# Patient Record
Sex: Male | Born: 1965 | Race: Black or African American | Hispanic: No | Marital: Married | Smoking: Never smoker
Health system: Southern US, Community
[De-identification: ages and names within clinical notes are randomized; demographics above are authoritative.]

## PROBLEM LIST (undated history)

## (undated) DIAGNOSIS — E785 Hyperlipidemia, unspecified: Secondary | ICD-10-CM

## (undated) DIAGNOSIS — M549 Dorsalgia, unspecified: Secondary | ICD-10-CM

## (undated) DIAGNOSIS — I8390 Asymptomatic varicose veins of unspecified lower extremity: Secondary | ICD-10-CM

## (undated) DIAGNOSIS — G8929 Other chronic pain: Secondary | ICD-10-CM

## (undated) DIAGNOSIS — K219 Gastro-esophageal reflux disease without esophagitis: Secondary | ICD-10-CM

## (undated) DIAGNOSIS — Z21 Asymptomatic human immunodeficiency virus [HIV] infection status: Secondary | ICD-10-CM

## (undated) DIAGNOSIS — N200 Calculus of kidney: Secondary | ICD-10-CM

## (undated) HISTORY — DX: Asymptomatic varicose veins of unspecified lower extremity: I83.90

## (undated) HISTORY — PX: KNEE SURGERY: SHX244

---

## 2001-11-14 ENCOUNTER — Emergency Department (HOSPITAL_COMMUNITY): Admission: EM | Admit: 2001-11-14 | Discharge: 2001-11-14 | Payer: Self-pay | Admitting: Emergency Medicine

## 2004-01-12 ENCOUNTER — Encounter: Admission: RE | Admit: 2004-01-12 | Discharge: 2004-01-12 | Payer: Self-pay | Admitting: *Deleted

## 2004-01-15 ENCOUNTER — Encounter: Admission: RE | Admit: 2004-01-15 | Discharge: 2004-01-15 | Payer: Self-pay | Admitting: Family Medicine

## 2004-10-22 ENCOUNTER — Encounter: Admission: RE | Admit: 2004-10-22 | Discharge: 2004-10-22 | Payer: Self-pay | Admitting: Family Medicine

## 2005-01-06 ENCOUNTER — Ambulatory Visit: Payer: Self-pay | Admitting: Internal Medicine

## 2005-01-16 ENCOUNTER — Ambulatory Visit: Payer: Self-pay | Admitting: Internal Medicine

## 2005-01-17 ENCOUNTER — Ambulatory Visit: Payer: Self-pay | Admitting: Infectious Diseases

## 2005-01-17 ENCOUNTER — Inpatient Hospital Stay (HOSPITAL_COMMUNITY): Admission: EM | Admit: 2005-01-17 | Discharge: 2005-01-20 | Payer: Self-pay | Admitting: Emergency Medicine

## 2005-01-18 ENCOUNTER — Ambulatory Visit: Payer: Self-pay | Admitting: Internal Medicine

## 2005-02-13 ENCOUNTER — Ambulatory Visit: Payer: Self-pay | Admitting: Infectious Diseases

## 2005-02-28 ENCOUNTER — Ambulatory Visit: Payer: Self-pay | Admitting: Infectious Diseases

## 2010-10-23 ENCOUNTER — Encounter: Payer: Self-pay | Admitting: Family Medicine

## 2014-05-04 ENCOUNTER — Encounter: Payer: Self-pay | Admitting: Internal Medicine

## 2015-06-29 ENCOUNTER — Emergency Department (HOSPITAL_BASED_OUTPATIENT_CLINIC_OR_DEPARTMENT_OTHER)
Admission: EM | Admit: 2015-06-29 | Discharge: 2015-06-29 | Disposition: A | Discharge status: Home / Self Care | Payer: Managed Care, Other (non HMO) | Attending: Emergency Medicine | Admitting: Emergency Medicine

## 2015-06-29 ENCOUNTER — Emergency Department (HOSPITAL_BASED_OUTPATIENT_CLINIC_OR_DEPARTMENT_OTHER): Payer: Managed Care, Other (non HMO)

## 2015-06-29 ENCOUNTER — Encounter (HOSPITAL_BASED_OUTPATIENT_CLINIC_OR_DEPARTMENT_OTHER): Payer: Self-pay | Admitting: *Deleted

## 2015-06-29 DIAGNOSIS — Y9389 Activity, other specified: Secondary | ICD-10-CM | POA: Diagnosis not present

## 2015-06-29 DIAGNOSIS — Z79899 Other long term (current) drug therapy: Secondary | ICD-10-CM | POA: Diagnosis not present

## 2015-06-29 DIAGNOSIS — Y998 Other external cause status: Secondary | ICD-10-CM | POA: Diagnosis not present

## 2015-06-29 DIAGNOSIS — X58XXXA Exposure to other specified factors, initial encounter: Secondary | ICD-10-CM | POA: Insufficient documentation

## 2015-06-29 DIAGNOSIS — Z8639 Personal history of other endocrine, nutritional and metabolic disease: Secondary | ICD-10-CM | POA: Insufficient documentation

## 2015-06-29 DIAGNOSIS — Z87442 Personal history of urinary calculi: Secondary | ICD-10-CM | POA: Insufficient documentation

## 2015-06-29 DIAGNOSIS — Y9289 Other specified places as the place of occurrence of the external cause: Secondary | ICD-10-CM | POA: Diagnosis not present

## 2015-06-29 DIAGNOSIS — K219 Gastro-esophageal reflux disease without esophagitis: Secondary | ICD-10-CM | POA: Diagnosis not present

## 2015-06-29 DIAGNOSIS — S39012A Strain of muscle, fascia and tendon of lower back, initial encounter: Secondary | ICD-10-CM

## 2015-06-29 DIAGNOSIS — R1031 Right lower quadrant pain: Secondary | ICD-10-CM | POA: Diagnosis present

## 2015-06-29 DIAGNOSIS — G8929 Other chronic pain: Secondary | ICD-10-CM | POA: Diagnosis not present

## 2015-06-29 DIAGNOSIS — Z21 Asymptomatic human immunodeficiency virus [HIV] infection status: Secondary | ICD-10-CM | POA: Diagnosis not present

## 2015-06-29 DIAGNOSIS — R109 Unspecified abdominal pain: Secondary | ICD-10-CM | POA: Diagnosis not present

## 2015-06-29 HISTORY — DX: Other chronic pain: G89.29

## 2015-06-29 HISTORY — DX: Gastro-esophageal reflux disease without esophagitis: K21.9

## 2015-06-29 HISTORY — DX: Asymptomatic human immunodeficiency virus (hiv) infection status: Z21

## 2015-06-29 HISTORY — DX: Calculus of kidney: N20.0

## 2015-06-29 HISTORY — DX: Hyperlipidemia, unspecified: E78.5

## 2015-06-29 HISTORY — DX: Dorsalgia, unspecified: M54.9

## 2015-06-29 LAB — BASIC METABOLIC PANEL
Anion gap: 6 (ref 5–15)
BUN: 12 mg/dL (ref 6–20)
CALCIUM: 8.9 mg/dL (ref 8.9–10.3)
CHLORIDE: 102 mmol/L (ref 101–111)
CO2: 29 mmol/L (ref 22–32)
CREATININE: 0.91 mg/dL (ref 0.61–1.24)
Glucose, Bld: 135 mg/dL — ABNORMAL HIGH (ref 65–99)
Potassium: 4.1 mmol/L (ref 3.5–5.1)
SODIUM: 137 mmol/L (ref 135–145)

## 2015-06-29 LAB — CBC WITH DIFFERENTIAL/PLATELET
BASOS PCT: 0 %
Basophils Absolute: 0 10*3/uL (ref 0.0–0.1)
EOS ABS: 0 10*3/uL (ref 0.0–0.7)
EOS PCT: 1 %
HCT: 43.3 % (ref 39.0–52.0)
HEMOGLOBIN: 15.3 g/dL (ref 13.0–17.0)
Lymphocytes Relative: 18 %
Lymphs Abs: 0.6 10*3/uL — ABNORMAL LOW (ref 0.7–4.0)
MCH: 32.1 pg (ref 26.0–34.0)
MCHC: 35.3 g/dL (ref 30.0–36.0)
MCV: 91 fL (ref 78.0–100.0)
MONOS PCT: 13 %
Monocytes Absolute: 0.4 10*3/uL (ref 0.1–1.0)
NEUTROS PCT: 68 %
Neutro Abs: 2.2 10*3/uL (ref 1.7–7.7)
PLATELETS: 149 10*3/uL — AB (ref 150–400)
RBC: 4.76 MIL/uL (ref 4.22–5.81)
RDW: 12 % (ref 11.5–15.5)
WBC: 3.2 10*3/uL — AB (ref 4.0–10.5)

## 2015-06-29 LAB — URINALYSIS, ROUTINE W REFLEX MICROSCOPIC
BILIRUBIN URINE: NEGATIVE
Glucose, UA: NEGATIVE mg/dL
HGB URINE DIPSTICK: NEGATIVE
KETONES UR: NEGATIVE mg/dL
Leukocytes, UA: NEGATIVE
NITRITE: NEGATIVE
PH: 6 (ref 5.0–8.0)
Protein, ur: NEGATIVE mg/dL
Specific Gravity, Urine: 1.018 (ref 1.005–1.030)
UROBILINOGEN UA: 0.2 mg/dL (ref 0.0–1.0)

## 2015-06-29 MED ORDER — MORPHINE SULFATE (PF) 4 MG/ML IV SOLN: 4.0000 mg | Freq: Once | INTRAVENOUS | Status: DC

## 2015-06-29 MED ORDER — KETOROLAC TROMETHAMINE 30 MG/ML IJ SOLN: 30.0000 mg | Freq: Once | INTRAMUSCULAR | Status: DC

## 2015-06-29 MED ORDER — SODIUM CHLORIDE 0.9 % IV BOLUS (SEPSIS)
1000.0000 mL | Freq: Once | INTRAVENOUS | Status: AC
  Administered 2015-06-29: 1000 mL via INTRAVENOUS

## 2015-06-29 MED ORDER — HYDROMORPHONE HCL 1 MG/ML IJ SOLN
1.0000 mg | Freq: Once | INTRAMUSCULAR | Status: AC
  Administered 2015-06-29: 1 mg via INTRAVENOUS
  Filled 2015-06-29: qty 1

## 2015-06-29 MED ORDER — IBUPROFEN 800 MG PO TABS: 800.0000 mg | ORAL_TABLET | Freq: Three times a day (TID) | ORAL | Status: DC | PRN

## 2015-06-29 MED ORDER — DIAZEPAM 5 MG PO TABS
10.0000 mg | ORAL_TABLET | Freq: Once | ORAL | Status: AC
  Administered 2015-06-29: 10 mg via ORAL
  Filled 2015-06-29: qty 2

## 2015-06-29 MED ORDER — ONDANSETRON HCL 4 MG/2ML IJ SOLN
4.0000 mg | Freq: Once | INTRAMUSCULAR | Status: AC
  Administered 2015-06-29: 4 mg via INTRAVENOUS
  Filled 2015-06-29: qty 2

## 2015-06-29 MED ORDER — DIAZEPAM 5 MG PO TABS: 5.0000 mg | ORAL_TABLET | Freq: Three times a day (TID) | ORAL | Status: DC | PRN

## 2015-06-29 NOTE — ED Provider Notes (Signed)
CSN: [id number]     Arrival date & time 06/29/15  1511 History   First MD Initiated Contact with Patient 06/29/15 1516     Chief Complaint  Patient presents with  . Flank Pain     (Consider location/radiation/quality/duration/timing/severity/associated sxs/prior Treatment) HPI 49 year old male who presents with right flank pain. History of nephrolithiasis and HIV with reported normal CD4 count. Had sudden onset of right flank pain radiating to the right lower quadrant this morning at 10 AM that woke him up from sleep. Associated with nausea, I reports this is the same pain that he had felt with his prior kidney stone. Denies fever, chills, vomiting, diarrhea, testicular pain or swelling, dysuria, or hematuria. I was initially seen by his primary care physician at [facility] physicians, and sent to the ED for further management. Did receive 60 mg of IM Toradol prior to arrival.   Past Medical History  Diagnosis Date  . Kidney stone   . HIV positive   . GERD (gastroesophageal reflux disease)   . Chronic back pain   . Dyslipidemia    Past Surgical History  Procedure Laterality Date  . Knee surgery     History reviewed. No pertinent family history. Social History  Substance Use Topics  . Smoking status: Never Smoker   . Smokeless tobacco: None  . Alcohol Use: Yes     Comment: 10 beers /week    Review of Systems 10/14 systems reviewed and are negative other than those stated in the HPI  Allergies  Review of patient's allergies indicates no known allergies.  Home Medications   Prior to Admission medications   Medication Sig Start Date End Date Taking? Authorizing Fleeta Kunde  efavirenz-emtricitabine-tenofovir (ATRIPLA) 600-200-300 MG tablet Take 1 tablet by mouth at bedtime.   Yes Historical Khori Rosevear, MD  OMEPRAZOLE PO Take by mouth.   Yes Historical Michelene Keniston, MD  sildenafil (REVATIO) 20 MG tablet Take 20 mg by mouth 3 (three) times daily.   Yes Historical Daxson Reffett, MD   BP 139/87  mmHg  Pulse 53  Temp(Src) 97.5 F (36.4 C) (Oral)  Resp 18  Ht  (1.778 m)  Wt 188 lb (85.276 kg)  BMI 26.98 kg/m2  SpO2 100% Physical Exam Physical Exam  Nursing note and vitals reviewed. Constitutional: Well developed, well nourished, non-toxic, and in no acute distress Head: Normocephalic and atraumatic.  Mouth/Throat: Oropharynx is clear and moist.  Neck: Normal range of motion. Neck supple.  Cardiovascular: Normal rate and regular rhythm.   Pulmonary/Chest: Effort normal and breath sounds normal.  Abdominal: Soft. Non-distended. Right CVA tenderness. Tenderness over right hemi-abdomen. There is no rebound and no guarding.  Musculoskeletal: Normal range of motion.  Neurological: Alert, no facial droop, fluent speech, moves all extremities symmetrically Skin: Skin is warm and dry.  Psychiatric: Cooperative  ED Course  Procedures (including critical care time) Labs Review Labs Reviewed  CBC WITH DIFFERENTIAL/PLATELET - Abnormal; Notable for the following:    WBC 3.2 (*)    Platelets 149 (*)    Lymphs Abs 0.6 (*)    All other components within normal limits  BASIC METABOLIC PANEL - Abnormal; Notable for the following:    Glucose, Bld 135 (*)    All other components within normal limits  URINALYSIS, ROUTINE W REFLEX MICROSCOPIC (NOT AT [facility])    Imaging Review Ct Renal Stone Study  06/29/2015   CLINICAL DATA:  Right flank pain beginning this morning.  EXAM: CT ABDOMEN AND PELVIS WITHOUT CONTRAST  TECHNIQUE: Multidetector CT  imaging of the abdomen and pelvis was performed following the standard protocol without IV contrast.  COMPARISON:  CT of the abdomen and pelvis with contrast 01/15/2004. CT of the abdomen and pelvis without contrast 01/12/2004.  FINDINGS: Minimal atelectasis is present at the lung bases. No focal nodule, mass, or other significant airspace disease is present. The heart size is normal. No significant pleural or pericardial effusion is present.  The  liver and spleen are within normal limits. The stomach, duodenum, and pancreas are within normal limits. The common bile duct and gallbladder are within normal limits.  The adrenal glands are normal bilaterally. Hyperdense lesions in the right kidney correspond to previously-seen cysts with some interval growth. No other focal lesions are evident. There are no stones. The ureters are of normal size. There is no evidence for obstruction. Urinary bladder is within normal limits.  The rectosigmoid colon is within normal limits. The more proximal colon is within normal limits. The appendix is visualized and normal. No significant adenopathy or free fluid is present. Previously-seen sub cm lymph nodes have decreased in size and number. Calcifications are present within the prostate gland without enlargement of the prostate. There is minimal fat herniated into the inguinal canals bilaterally without significant bowel.  Mild endplate degenerative changes are present at L5-S1. Bone windows are otherwise unremarkable.  IMPRESSION: 1. No significant nephrolithiasis or urinary tract obstruction. 2. Benign appearing hypodense lesions in the inner right kidney compatible with previously-seen cysts. 3. Previously noted lymph nodes have decreased in size and number. These are within normal limits on today's exam. 4. Mild degenerative changes at L5-S1.   Electronically Signed   By: Marin Roberts M.D.   On: 06/29/2015 16:36   I have personally reviewed and evaluated these images and lab results as part of my medical decision-making.    MDM   Final diagnoses:  Right flank pain  Back strain, initial encounter    49 year old male with no prior abdominal surgeries who presents with right flank pain radiating into the right abdomen, similar in presentation to prior history of kidney stones. I he is nontoxic, but appears very uncomfortable secondary to pain. He is writhing in bed and unable to lay still. Vital signs  overall non-concerning. Significant CVA tenderness noted as well as right sided abdominal tenderness. Pain also reproduced with palpation of the right paraspinal muscles. Abdomen overall soft and non-peritoneal. Given that he states that this is similar to prior kidney stones initial supportive care with IV fluids, antiemetics, analgesics were provided. Patient was still having significant pain afterwards, and there is concern for larger kidney stone that would require urological intervention. A CT abdomen pelvis without contrast was performed. This did not show any acute intra-abdominal processes that could explain his symptoms. No evidence of kidney stone, hepatobiliary pathology, appendicitis, or other processes. Likely due to backs strain, and we discussed supportive care for home. No neuro complaints and only paraspinal pain; thus no suspicion at this time for spine cord issues or other serious etiologies of back pain.  Strict return and follow-up instructions reviewed. He expressed understanding of all discharge instructions for comfortable plan of care.    Lavera Guise, MD 06/29/15 [phone]

## 2015-06-29 NOTE — ED Notes (Signed)
Right flank pain since 10am. Sent from his doctors office to be treated for a kidney stones. Pt is pacing and restless.

## 2015-06-29 NOTE — Discharge Instructions (Signed)
Ice your back at rest and avoid prolonged bedrest. Return for worsening symptoms, including fever, escalating pain, or any other symptoms concerning to you.  Back Pain, Adult Back pain is very common. The pain often gets better over time. The cause of back pain is usually not dangerous. Most people can learn to manage their back pain on their own.  HOME CARE   Stay active. Start with short walks on flat ground if you can. Try to walk farther each day.  Do not sit, drive, or stand in one place for more than 30 minutes. Do not stay in bed.  Do not avoid exercise or work. Activity can help your back heal faster.  Be careful when you bend or lift an object. Bend at your knees, keep the object close to you, and do not twist.  Sleep on a firm mattress. Lie on your side, and bend your knees. If you lie on your back, put a pillow under your knees.  Only take medicines as told by your doctor.  Put ice on the injured area.  Put ice in a plastic bag.  Place a towel between your skin and the bag.  Leave the ice on for 15-20 minutes, 03-04 times a day for the first 2 to 3 days. After that, you can switch between ice and heat packs.  Ask your doctor about back exercises or massage.  Avoid feeling anxious or stressed. Find good ways to deal with stress, such as exercise. GET HELP RIGHT AWAY IF:   Your pain does not go away with rest or medicine.  Your pain does not go away in 1 week.  You have new problems.  You do not feel well.  The pain spreads into your legs.  You cannot control when you poop (bowel movement) or pee (urinate).  Your arms or legs feel weak or lose feeling (numbness).  You feel sick to your stomach (nauseous) or throw up (vomit).  You have belly (abdominal) pain.  You feel like you may pass out (faint). MAKE SURE YOU:   Understand these instructions.  Will watch your condition.  Will get help right away if you are not doing well or get worse. Document  Released: 03/06/2008 Document Revised: 12/11/2011 Document Reviewed: 01/20/2014 [facility] Patient Information 2015 [place], [place]. This information is not intended to replace advice given to you by your health care provider. Make sure you discuss any questions you have with your health care provider.

## 2015-12-10 ENCOUNTER — Other Ambulatory Visit: Payer: Self-pay | Admitting: *Deleted

## 2015-12-10 ENCOUNTER — Encounter: Payer: Self-pay | Admitting: Vascular Surgery

## 2015-12-10 DIAGNOSIS — I83891 Varicose veins of right lower extremities with other complications: Secondary | ICD-10-CM

## 2016-01-07 ENCOUNTER — Encounter: Payer: Managed Care, Other (non HMO) | Admitting: Vascular Surgery

## 2016-01-07 ENCOUNTER — Encounter (HOSPITAL_COMMUNITY): Payer: Managed Care, Other (non HMO)

## 2016-01-12 ENCOUNTER — Encounter: Payer: Self-pay | Admitting: Vascular Surgery

## 2016-01-19 ENCOUNTER — Encounter: Payer: Managed Care, Other (non HMO) | Admitting: Vascular Surgery

## 2016-01-19 ENCOUNTER — Ambulatory Visit (HOSPITAL_COMMUNITY): Payer: Managed Care, Other (non HMO)

## 2016-04-07 ENCOUNTER — Encounter: Payer: Self-pay | Admitting: Vascular Surgery

## 2016-04-10 ENCOUNTER — Ambulatory Visit (INDEPENDENT_AMBULATORY_CARE_PROVIDER_SITE_OTHER): Payer: Managed Care, Other (non HMO) | Admitting: Vascular Surgery

## 2016-04-10 ENCOUNTER — Encounter: Payer: Self-pay | Admitting: Vascular Surgery

## 2016-04-10 VITALS — BP 126/82 | HR 96 | Temp 98.0°F | Resp 16 | Ht 70.0 in | Wt 196.0 lb

## 2016-04-10 DIAGNOSIS — I83891 Varicose veins of right lower extremities with other complications: Secondary | ICD-10-CM | POA: Diagnosis not present

## 2016-04-10 DIAGNOSIS — I868 Varicose veins of other specified sites: Secondary | ICD-10-CM | POA: Diagnosis not present

## 2016-04-10 NOTE — Progress Notes (Signed)
Subjective:     Patient ID: Devin Hines, male   DOB: 03/11/1966, 50 y.o.   MRN: [id number]  HPI this 50 year old male was referred by Waynetta Pean for evaluation of varicose veins in the right leg with recent episode of rupture of right ankle varicosity. This has happened on 2 occasions in the past 6 weeks. On 1 occasion it required 30 minutes of pressure to stop the bleeding. He has no history of DVT thrombophlebitis or stasis ulcers. He does not elastic compression stockings. He has had no symptoms in the contralateral left leg. His right leg does develop aching and throbbing discomfort in the calf and ankle area is a day progresses. He has also noticed that the skin in the right ankle area has become somewhat darkened.  Past Medical History  Diagnosis Date  . Kidney stone   . HIV positive (HCC)   . GERD (gastroesophageal reflux disease)   . Chronic back pain   . Dyslipidemia   . Varicose veins     Social History  Substance Use Topics  . Smoking status: Never Smoker   . Smokeless tobacco: Never Used  . Alcohol Use: 0.0 oz/week    0 Standard drinks or equivalent per week     Comment: 10 beers /week    Family History  Problem Relation Age of Onset  . Heart disease Mother   . Hypertension Mother     No Known Allergies   Current outpatient prescriptions:  .  efavirenz-emtricitabine-tenofovir (ATRIPLA) 600-200-300 MG tablet, Take 1 tablet by mouth at bedtime., Disp: , Rfl:  .  ibuprofen (ADVIL,MOTRIN) 800 MG tablet, Take 1 tablet (800 mg total) by mouth every 8 (eight) hours as needed for moderate pain or cramping., Disp: 21 tablet, Rfl: 0 .  OMEPRAZOLE PO, Take by mouth., Disp: , Rfl:  .  sildenafil (REVATIO) 20 MG tablet, Take 20 mg by mouth 3 (three) times daily., Disp: , Rfl:  .  diazepam (VALIUM) 5 MG tablet, Take 1 tablet (5 mg total) by mouth every 8 (eight) hours as needed for muscle spasms. (Patient not taking: Reported on 04/10/2016), Disp: 10 tablet, Rfl: 0  Filed  Vitals:   04/10/16 1134  BP: 126/82  Pulse: 96  Temp: 98 F (36.7 C)  Resp: 16  Height:  (1.778 m)  Weight: 196 lb (88.905 kg)  SpO2: 98%    Body mass index is 28.12 kg/(m^2).          Review of Systems denies chest pain, dyspnea on exertion, PND, orthopnea, hemoptysis, claudication. Patient is HIV positive. Has history of hyperlipidemia, GERD. All other systems negative and complete review of systems    Objective:   Physical Exam BP 126/82 mmHg  Pulse 96  Temp(Src) 98 F (36.7 C)  Resp 16  Ht  (1.778 m)  Wt 196 lb (88.905 kg)  BMI 28.12 kg/m2  SpO2 98%    Gen.-alert and oriented x3 in no apparent distress HEENT normal for age Lungs no rhonchi or wheezing Cardiovascular regular rhythm no murmurs carotid pulses 3+ palpable no bruits audible Abdomen soft nontender no palpable masses Musculoskeletal free of  major deformities Skin clear -no rashes Neurologic normal Lower extremities 3+ femoral and dorsalis pedis pulses palpable bilaterally with no edema on the left 1+ edema on right Right leg with large bulging varicosities and distal thigh and medial calf with network of reticular veins around right medial malleolus where bleeding has occurred on 2 occasions. Early hyperpigmentation present. No large  bulging varicosities in the left leg. One prominent vein in the left medial thigh.  Today I performed a bedside SonoSite-ultrasound exam. The right great saphenous vein is large in caliber and has continuous gross reflux and supplies these painful varicosities.       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 Because of the 2 recent episodes of hemorrhage

## 2016-04-13 ENCOUNTER — Encounter: Payer: Self-pay | Admitting: Vascular Surgery

## 2016-04-18 ENCOUNTER — Ambulatory Visit (INDEPENDENT_AMBULATORY_CARE_PROVIDER_SITE_OTHER): Payer: Managed Care, Other (non HMO) | Admitting: Vascular Surgery

## 2016-04-18 ENCOUNTER — Ambulatory Visit (HOSPITAL_COMMUNITY)
Admission: RE | Admit: 2016-04-18 | Discharge: 2016-04-18 | Disposition: A | Discharge status: Home / Self Care | Payer: Managed Care, Other (non HMO) | Source: Ambulatory Visit | Attending: Vascular Surgery | Admitting: Vascular Surgery

## 2016-04-18 ENCOUNTER — Encounter: Payer: Self-pay | Admitting: Vascular Surgery

## 2016-04-18 VITALS — BP 119/79 | HR 74 | Temp 97.3°F | Resp 18 | Ht 70.0 in | Wt 195.0 lb

## 2016-04-18 DIAGNOSIS — B20 Human immunodeficiency virus [HIV] disease: Secondary | ICD-10-CM | POA: Diagnosis not present

## 2016-04-18 DIAGNOSIS — I83891 Varicose veins of right lower extremities with other complications: Secondary | ICD-10-CM | POA: Insufficient documentation

## 2016-04-18 DIAGNOSIS — E785 Hyperlipidemia, unspecified: Secondary | ICD-10-CM | POA: Diagnosis not present

## 2016-04-18 DIAGNOSIS — K219 Gastro-esophageal reflux disease without esophagitis: Secondary | ICD-10-CM | POA: Insufficient documentation

## 2016-04-18 NOTE — Progress Notes (Signed)
Subjective:     Patient ID: Devin Hines, male   DOB: 05/06/1966, 50 y.o.   MRN: [id number]  HPI this 50 year old male returns for further discussion regarding his recent bleeding episodes from a varix in the right ankle. He was evaluated last week and was felt have gross reflux in the right great saphenous vein by bedside ultrasound exam. He has now had a formal venous ultrasound study which revealed gross reflux in the right great saphenous vein supplying painful varicosities in the area of previous bleeding on 2 occasions. He has no history of DVT thrombophlebitis stasis ulcers.  Past Medical History  Diagnosis Date  . Kidney stone   . HIV positive (HCC)   . GERD (gastroesophageal reflux disease)   . Chronic back pain   . Dyslipidemia   . Varicose veins     Social History  Substance Use Topics  . Smoking status: Never Smoker   . Smokeless tobacco: Never Used  . Alcohol Use: 0.0 oz/week    0 Standard drinks or equivalent per week     Comment: 10 beers /week    Family History  Problem Relation Age of Onset  . Heart disease Mother   . Hypertension Mother     No Known Allergies   Current outpatient prescriptions:  .  efavirenz-emtricitabine-tenofovir (ATRIPLA) 600-200-300 MG tablet, Take 1 tablet by mouth at bedtime., Disp: , Rfl:  .  OMEPRAZOLE PO, Take by mouth., Disp: , Rfl:  .  sildenafil (REVATIO) 20 MG tablet, Take 20 mg by mouth 3 (three) times daily. Reported on 04/18/2016, Disp: , Rfl:  .  diazepam (VALIUM) 5 MG tablet, Take 1 tablet (5 mg total) by mouth every 8 (eight) hours as needed for muscle spasms. (Patient not taking: Reported on 04/10/2016), Disp: 10 tablet, Rfl: 0 .  ibuprofen (ADVIL,MOTRIN) 800 MG tablet, Take 1 tablet (800 mg total) by mouth every 8 (eight) hours as needed for moderate pain or cramping. (Patient not taking: Reported on 04/18/2016), Disp: 21 tablet, Rfl: 0  Filed Vitals:   04/18/16 0843  BP: 119/79  Pulse: 74  Temp: 97.3 F (36.3 C)   Resp: 18  Height: 5\' 10"  (1.778 m)  Weight: 195 lb (88.451 kg)  SpO2: 98%    Body mass index is 27.98 kg/(m^2).           Review of Systems denies chest pain, dyspnea on exertion, PND, orthopnea, hemoptysis     Objective:   Physical Exam BP 119/79 mmHg  Pulse 74  Temp(Src) 97.3 F (36.3 C)  Resp 18  Ht 5\' 10"  (1.778 m)  Wt 195 lb (88.451 kg)  BMI 27.98 kg/m2  SpO2 98%  Gen. well-developed well-nourished male no apparent distress alert and oriented 3 Right leg with bulging varicosities in the medial calf with reticular and spider veins medial malleolar area with hyperpigmentation. This is area of previous bleeding. 1+ chronic edema.  Today I ordered a venous duplex exam the right leg which I reviewed and interpreted. There is no DVT. There is gross reflux throughout the right great saphenous vein supplying these painful varicosities in the area of bleeding.     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 We will proceed with precertification now since patient has had recent bleeding on 2 occasions and believe we should waive  the three-month waiting.

## 2016-04-20 ENCOUNTER — Encounter: Payer: Self-pay | Admitting: Vascular Surgery

## 2016-04-24 ENCOUNTER — Ambulatory Visit (INDEPENDENT_AMBULATORY_CARE_PROVIDER_SITE_OTHER): Payer: Managed Care, Other (non HMO) | Admitting: Vascular Surgery

## 2016-04-24 ENCOUNTER — Encounter: Payer: Self-pay | Admitting: Vascular Surgery

## 2016-04-24 VITALS — BP 148/93 | HR 79 | Temp 98.0°F | Resp 16 | Ht 70.0 in | Wt 191.0 lb

## 2016-04-24 DIAGNOSIS — I83891 Varicose veins of right lower extremities with other complications: Secondary | ICD-10-CM | POA: Diagnosis not present

## 2016-04-24 NOTE — Progress Notes (Signed)
Subjective:     Patient ID: Devin Hines, male   DOB: [birth year], 50 y.o.   MRN: [id number]  HPI this 50 year old male had laser ablation of the right great saphenous vein from the proximal calf to near the saphenofemoral junction +10-20 stab phlebectomy of painful varicosities performed under local tumescent anesthesia. He also had sclerotherapy of a previous bleeding site in the right ankle area. He tolerated all procedures well. Review of Systems     Objective:   Physical Exam BP (!) 148/93   Pulse 79   Temp 98 F (36.7 C)   Resp 16   Ht 5\' 10"  (1.778 m)   Wt 191 lb (86.6 kg)   SpO2 100%   BMI 27.41 kg/m       Assessment:     Well-tolerated laser ablation right great saphenous vein plus multiple stab phlebectomy-10-20 painful varicosities performed under local tumescent anesthesia. Patient also had sclerotherapy of bleeding site and right ankle.    Plan:     Return in 1 week for venous duplex exam to confirm closure right great saphenous vein and this will complete patient's treatment regimen

## 2016-04-24 NOTE — Progress Notes (Signed)
Vitals:   04/24/16 1045 04/24/16 1046  BP: (!) 146/97 (!) 148/93  Pulse: 79   Resp: 16   Temp: 98 F (36.7 C)   SpO2: 100%   Weight: 191 lb (86.6 kg)   Height: 5\' 10"  (1.778 m)

## 2016-04-24 NOTE — Progress Notes (Signed)
Laser Ablation Procedure    Date: 04/24/2016   Devin Hines DOB:1966-04-03  Consent signed: Yes    Surgeon:  Dr. Quita Skye. [name]  Procedure: Laser Ablation: right Greater Saphenous Vein  BP (!) 148/93   Pulse 79   Temp 98 F (36.7 C)   Resp 16   Ht 5\' 10"  (1.778 m)   Wt 191 lb (86.6 kg)   SpO2 100%   BMI 27.41 kg/m   Tumescent Anesthesia: 450 cc 0.9% NaCl with 50 cc Lidocaine HCL with 1% Epi and 15 cc 8.4% NaHCO3  Local Anesthesia: 9 cc Lidocaine HCL and NaHCO3 (ratio 2:1)  Pulsed Mode: 15 watts, delay, 1.0 duration  Total Energy: 2961             Total Pulses:   198             Total Time:  3:17 Sclerotherapy: .3% %Sotradecol. Patient received a total of 5 cc  Stab Phlebectomy: 10-20 Sites: Calf  Patient tolerated procedure well  Notes: HIV positive  Description of Procedure:  After marking the course of the secondary varicosities, the patient was placed on the operating table in the supine position, and the right leg was prepped and draped in sterile fashion.   Local anesthetic was administered and under ultrasound guidance the saphenous vein was accessed with a micro needle and guide wire; then the mirco puncture sheath was placed.  A guide wire was inserted saphenofemoral junction , followed by a 5 french sheath.  The position of the sheath and then the laser fiber below the junction was confirmed using the ultrasound.  Tumescent anesthesia was administered along the course of the saphenous vein using ultrasound guidance. The patient was placed in Trendelenburg position and protective laser glasses were placed on patient and staff, and the laser was fired at 15 watts continuous mode advancing 1-28mm/second for a total of 2961 joules.   For stab phlebectomies, local anesthetic was administered at the previously marked varicosities, and tumescent anesthesia was administered around the vessels.  Ten to 20 stab wounds were made using the tip of an 11 blade. And using the  vein hook, the phlebectomies were performed using a hemostat to avulse the varicosities.  Adequate hemostasis was achieved.   Sclerotherapy was performed to 4 varicosities using 5  cc .3% Sotradecol foam via a 27g butterfly needle.  Steri strips were applied to the stab wounds and ABD pads and thigh high compression stockings were applied.  Ace wrap bandages were applied over the phlebectomy sites and at the top of the saphenofemoral junction. Blood loss was less than 15 cc.  The patient ambulated out of the operating room having tolerated the procedure well.

## 2016-04-25 ENCOUNTER — Telehealth: Payer: Self-pay | Admitting: *Deleted

## 2016-04-25 NOTE — Telephone Encounter (Signed)
Pt did not have any bleeding from stab sites. Told him it would be ok to loosen the ace wraps particularly around the lower calf. He is following all instructions. We will see him next week for his fu appts.

## 2016-04-26 ENCOUNTER — Encounter: Payer: Self-pay | Admitting: Vascular Surgery

## 2016-04-28 ENCOUNTER — Encounter: Payer: Self-pay | Admitting: Vascular Surgery

## 2016-05-01 ENCOUNTER — Other Ambulatory Visit: Payer: Self-pay | Admitting: *Deleted

## 2016-05-01 ENCOUNTER — Ambulatory Visit: Payer: Managed Care, Other (non HMO) | Admitting: Vascular Surgery

## 2016-05-01 ENCOUNTER — Encounter (HOSPITAL_COMMUNITY): Payer: Managed Care, Other (non HMO)

## 2016-05-01 DIAGNOSIS — I83811 Varicose veins of right lower extremities with pain: Secondary | ICD-10-CM

## 2016-05-02 ENCOUNTER — Encounter (HOSPITAL_COMMUNITY): Payer: Managed Care, Other (non HMO)

## 2016-05-02 ENCOUNTER — Ambulatory Visit: Payer: Self-pay | Admitting: Vascular Surgery

## 2016-05-02 ENCOUNTER — Ambulatory Visit (HOSPITAL_COMMUNITY)
Admission: RE | Admit: 2016-05-02 | Discharge: 2016-05-02 | Disposition: A | Discharge status: Home / Self Care | Payer: Managed Care, Other (non HMO) | Source: Ambulatory Visit | Attending: Vascular Surgery | Admitting: Vascular Surgery

## 2016-05-02 ENCOUNTER — Encounter: Payer: Self-pay | Admitting: Vascular Surgery

## 2016-05-02 ENCOUNTER — Ambulatory Visit (INDEPENDENT_AMBULATORY_CARE_PROVIDER_SITE_OTHER): Payer: Managed Care, Other (non HMO) | Admitting: Vascular Surgery

## 2016-05-02 VITALS — BP 134/90 | HR 90 | Temp 97.8°F | Resp 18 | Ht 70.0 in | Wt 190.0 lb

## 2016-05-02 DIAGNOSIS — Z9889 Other specified postprocedural states: Secondary | ICD-10-CM | POA: Insufficient documentation

## 2016-05-02 DIAGNOSIS — I83811 Varicose veins of right lower extremities with pain: Secondary | ICD-10-CM | POA: Diagnosis not present

## 2016-05-02 DIAGNOSIS — I83891 Varicose veins of right lower extremities with other complications: Secondary | ICD-10-CM

## 2016-05-02 NOTE — Progress Notes (Signed)
Subjective:     Patient ID: Devin Hines, male   DOB: 05-08-1966, 50 y.o.   MRN: [id number]  HPI this 50 year old male returns 1 week post-laser ablation right great saphenous vein plus multiple stab phlebectomy of painful varicosities. He also had sclerotherapy had a previous bleeding site. He's had no chest pain, dyspnea on exertion, PND, orthopnea, or hemoptysis. He has worn his elastic compression stocking and taken ibuprofen as instructed. The discomfort in the medial proximal thigh has diminished. He has noticed decreased swelling in the right ankle since the procedure   Review of Systems     Objective:   Physical Exam BP 134/90 (BP Location: Left Arm, Patient Position: Sitting, Cuff Size: Normal)   Pulse 90   Temp 97.8 F (36.6 C)   Resp 18   Ht 5\' 10"  (1.778 m)   Wt 190 lb (86.2 kg)   SpO2 97%   BMI 27.26 kg/m   Gen. well-developed well-nourished male no apparent distress alert and oriented 3 Lungs no rhonchi or wheezing Right leg with nicely healing stab phlebectomy sites below the knee medially. No distal edema noted. 3+ dorsalis pedis pulse palpable. Mild discomfort to deep palpation over proximal great saphenous vein. No erythema.  Today I ordered a venous duplex exam of the right leg which I reviewed and interpreted. There is no DVT. There is total closure of the right great saphenous vein from the proximal calf to near the saphenofemoral junction     Assessment:     Successful laser ablation right great saphenous vein with multiple stab phlebectomy of painful varicosities for previous bleeding and pain    Plan:     Return to see me on when necessary basis

## 2016-05-04 ENCOUNTER — Encounter: Payer: Managed Care, Other (non HMO) | Admitting: Vascular Surgery

## 2016-05-04 ENCOUNTER — Encounter (HOSPITAL_COMMUNITY): Payer: Managed Care, Other (non HMO)

## 2017-01-04 ENCOUNTER — Ambulatory Visit (INDEPENDENT_AMBULATORY_CARE_PROVIDER_SITE_OTHER): Payer: 59

## 2017-01-04 ENCOUNTER — Ambulatory Visit (INDEPENDENT_AMBULATORY_CARE_PROVIDER_SITE_OTHER): Payer: 59 | Admitting: Orthopedic Surgery

## 2017-01-04 DIAGNOSIS — M25562 Pain in left knee: Secondary | ICD-10-CM

## 2017-01-04 DIAGNOSIS — G8929 Other chronic pain: Secondary | ICD-10-CM | POA: Diagnosis not present

## 2017-01-04 MED ORDER — METHYLPREDNISOLONE ACETATE 40 MG/ML IJ SUSP
40.0000 mg | INTRAMUSCULAR | Status: AC | PRN
  Administered 2017-01-04: 40 mg via INTRA_ARTICULAR

## 2017-01-04 MED ORDER — LIDOCAINE HCL 1 % IJ SOLN
5.0000 mL | INTRAMUSCULAR | Status: AC | PRN
  Administered 2017-01-04: 5 mL

## 2017-01-04 NOTE — Progress Notes (Signed)
Office Visit Note   Patient: Devin Hines           Date of Birth: 1966/03/24           MRN: [id number] Visit Date: 01/04/2017              Requested by: Catha Gosselin, MD [address] [place], [place] [address] PCP: Mickie Hillier, MD  Chief Complaint  Patient presents with  . Left Knee - Pain      HPI: The patient is a 51 year old gentleman who presents today for evaluation of left knee pain. No known injury. This been ongoing for about 6 weeks. He states that it feels like he is being stabbed by a hot knife  medially. Planing of decreased range of motion pain with flexion. States the pain wakes him from sleep. Does complain of swelling. Has been using a heating pad in the evening. States he is taking ibuprofen and Motrin with moderate relief of pain  Assessment & Plan: Visit Diagnoses:  1. Chronic pain of left knee     Plan: Follow-up in office in 4 weeks if continued pain  Follow-Up Instructions: Return in about 4 weeks (around 02/01/2017), or if symptoms worsen or fail to improve.   Left Knee Exam   Tenderness  The patient is experiencing tenderness in the medial joint line.  Range of Motion  The patient has normal left knee ROM.  Tests  Varus: negative Valgus: negative  Other  Swelling: none      Patient is alert, oriented, no adenopathy, well-dressed, normal affect, normal respiratory effort.   Imaging: Xr Knee 1-2 Views Left  Result Date: 01/04/2017 Radiographs of the left knee show bilateral medial joint space narrowing and degenerative changes. Acute findings   Labs: No results found for: HGBA1C, ESRSEDRATE, CRP, LABURIC, REPTSTATUS, GRAMSTAIN, CULT, LABORGA  Orders:  Orders Placed This Encounter  Procedures  . XR Knee 1-2 Views Left   No orders of the defined types were placed in this encounter.    Procedures: Large Joint Inj Date/Time: 01/04/2017 4:12 PM Performed by: Barnie Del R Authorized by: Barnie Del R   Consent  Given by:  Patient Site marked: the procedure site was marked   Timeout: prior to procedure the correct patient, procedure, and site was verified   Indications:  Pain and diagnostic evaluation Location:  Knee Site:  L knee Needle Size:  22 G Needle Length:  1.5 inches Ultrasound Guidance: No   Fluoroscopic Guidance: No   Arthrogram: No   Medications:  5 mL lidocaine 1 %; 40 mg methylPREDNISolone acetate 40 MG/ML Aspiration Attempted: No   Patient tolerance:  Patient tolerated the procedure well with no immediate complications    Clinical Data: No additional findings.  ROS: Review of Systems  Constitutional: Negative for chills and fever.  Cardiovascular: Negative for leg swelling.  Musculoskeletal: Positive for arthralgias and gait problem. Negative for joint swelling.    Objective: Vital Signs: There were no vitals taken for this visit.  Specialty Comments:  No specialty comments available.  PMFS History: Patient Active Problem List   Diagnosis Date Noted  . Varicose veins of right lower extremity with complications 04/18/2016   Past Medical History:  Diagnosis Date  . Chronic back pain   . Dyslipidemia   . GERD (gastroesophageal reflux disease)   . HIV positive (HCC)   . Kidney stone   . Varicose veins     Family History  Problem Relation Age of Onset  .  Heart disease Mother   . Hypertension Mother     Past Surgical History:  Procedure Laterality Date  . KNEE SURGERY     Social History   Occupational History  . Not on file.   Social History Main Topics  . Smoking status: Never Smoker  . Smokeless tobacco: Never Used  . Alcohol use 0.0 oz/week     Comment: 10 beers /week  . Drug use: No  . Sexual activity: Not on file

## 2017-02-01 ENCOUNTER — Ambulatory Visit (INDEPENDENT_AMBULATORY_CARE_PROVIDER_SITE_OTHER): Payer: 59 | Admitting: Orthopedic Surgery

## 2017-12-24 DIAGNOSIS — E782 Mixed hyperlipidemia: Secondary | ICD-10-CM | POA: Diagnosis not present

## 2017-12-24 DIAGNOSIS — N529 Male erectile dysfunction, unspecified: Secondary | ICD-10-CM | POA: Diagnosis not present

## 2017-12-24 DIAGNOSIS — Z0000 Encounter for general adult medical examination without abnormal findings: Secondary | ICD-10-CM | POA: Diagnosis not present

## 2018-01-09 DIAGNOSIS — I10 Essential (primary) hypertension: Secondary | ICD-10-CM | POA: Diagnosis not present

## 2018-01-09 DIAGNOSIS — Z21 Asymptomatic human immunodeficiency virus [HIV] infection status: Secondary | ICD-10-CM | POA: Diagnosis not present

## 2018-01-09 DIAGNOSIS — Z23 Encounter for immunization: Secondary | ICD-10-CM | POA: Diagnosis not present

## 2018-01-09 DIAGNOSIS — J329 Chronic sinusitis, unspecified: Secondary | ICD-10-CM | POA: Diagnosis not present

## 2018-02-13 ENCOUNTER — Ambulatory Visit (INDEPENDENT_AMBULATORY_CARE_PROVIDER_SITE_OTHER): Payer: BLUE CROSS/BLUE SHIELD | Admitting: Orthopedic Surgery

## 2018-02-13 ENCOUNTER — Encounter (INDEPENDENT_AMBULATORY_CARE_PROVIDER_SITE_OTHER): Payer: Self-pay | Admitting: Orthopedic Surgery

## 2018-02-13 VITALS — Ht 70.0 in | Wt 190.0 lb

## 2018-02-13 DIAGNOSIS — G8929 Other chronic pain: Secondary | ICD-10-CM

## 2018-02-13 DIAGNOSIS — M25562 Pain in left knee: Secondary | ICD-10-CM | POA: Diagnosis not present

## 2018-02-13 DIAGNOSIS — M25462 Effusion, left knee: Secondary | ICD-10-CM

## 2018-02-13 MED ORDER — LIDOCAINE HCL 1 % IJ SOLN
5.0000 mL | INTRAMUSCULAR | Status: AC | PRN
  Administered 2018-02-13: 5 mL

## 2018-02-13 MED ORDER — METHYLPREDNISOLONE ACETATE 40 MG/ML IJ SUSP
40.0000 mg | INTRAMUSCULAR | Status: AC | PRN
  Administered 2018-02-13: 40 mg via INTRA_ARTICULAR

## 2018-02-13 NOTE — Progress Notes (Signed)
Office Visit Note   Patient: Devin Hines           Date of Birth: Dec 14, 1965           MRN: [id number] Visit Date: 02/13/2018              Requested by: Catha Gosselin, MD [address] [place], [place] [address] PCP: Catha Gosselin, MD  Chief Complaint  Patient presents with  . Left Knee - Follow-up, Pain    S/p injection 12/2016      HPI: Patient is a 52 year old gentleman who is about a year out from the previous aspiration injection left knee.  Patient states he got about a years worth of pain relief.  Patient states with the increased swelling he has pain in the popliteal fossa that radiates proximally and distally.  Assessment & Plan: Visit Diagnoses:  1. Chronic pain of left knee   2. Effusion, left knee     Plan: Knee was aspirated tolerated this well he will follow-up as needed.  Follow-Up Instructions: Return if symptoms worsen or fail to improve.   Ortho Exam  Patient is alert, oriented, no adenopathy, well-dressed, normal affect, normal respiratory effort. On examination patient has a tense effusion of the left knee he lacks full range of motion due to the swelling collaterals and cruciates are stable there is no warmth no redness no signs of infection.  Patient was aspirated from the superior lateral portal of clear fluid.  Patient has an antalgic gait with varus alignment to the left knee.  Imaging: No results found. No images are attached to the encounter.  Labs: No results found for: HGBA1C, ESRSEDRATE, CRP, LABURIC, REPTSTATUS, GRAMSTAIN, CULT, LABORGA   No results found for: ALBUMIN, PREALBUMIN, LABURIC  Body mass index is 27.26 kg/m.  Orders:  No orders of the defined types were placed in this encounter.  No orders of the defined types were placed in this encounter.    Procedures: Large Joint Inj: L knee on 02/13/2018 2:32 PM Indications: pain and diagnostic evaluation Details: 22 G 1.5 in needle, superolateral approach  Arthrogram:  No  Medications: 5 mL lidocaine 1 %; 40 mg methylPREDNISolone acetate 40 MG/ML Aspirate: 60 mL clear Outcome: tolerated well, no immediate complications Procedure, treatment alternatives, risks and benefits explained, specific risks discussed. Consent was given by the patient. Immediately prior to procedure a time out was called to verify the correct patient, procedure, equipment, support staff and site/side marked as required. Patient was prepped and draped in the usual sterile fashion.      Clinical Data: No additional findings.  ROS:  All other systems negative, except as noted in the HPI. Review of Systems  Objective: Vital Signs: Ht  (1.778 m)   Wt 190 lb (86.2 kg)   BMI 27.26 kg/m   Specialty Comments:  No specialty comments available.  PMFS History: Patient Active Problem List   Diagnosis Date Noted  . Effusion, left knee 02/13/2018  . Varicose veins of right lower extremity with complications 04/18/2016   Past Medical History:  Diagnosis Date  . Chronic back pain   . Dyslipidemia   . GERD (gastroesophageal reflux disease)   . HIV positive (HCC)   . Kidney stone   . Varicose veins     Family History  Problem Relation Age of Onset  . Heart disease Mother   . Hypertension Mother     Past Surgical History:  Procedure Laterality Date  . KNEE SURGERY  Social History   Occupational History  . Not on file  Tobacco Use  . Smoking status: Never Smoker  . Smokeless tobacco: Never Used  Substance and Sexual Activity  . Alcohol use: Yes    Alcohol/week: 0.0 oz    Comment: 10 beers /week  . Drug use: No  . Sexual activity: Not on file

## 2018-07-17 DIAGNOSIS — I10 Essential (primary) hypertension: Secondary | ICD-10-CM | POA: Diagnosis not present

## 2018-07-17 DIAGNOSIS — G4700 Insomnia, unspecified: Secondary | ICD-10-CM | POA: Diagnosis not present

## 2018-07-17 DIAGNOSIS — M1712 Unilateral primary osteoarthritis, left knee: Secondary | ICD-10-CM | POA: Diagnosis not present

## 2018-07-17 DIAGNOSIS — Z21 Asymptomatic human immunodeficiency virus [HIV] infection status: Secondary | ICD-10-CM | POA: Diagnosis not present

## 2018-08-15 ENCOUNTER — Ambulatory Visit (INDEPENDENT_AMBULATORY_CARE_PROVIDER_SITE_OTHER): Payer: BLUE CROSS/BLUE SHIELD | Admitting: Orthopedic Surgery

## 2018-08-15 ENCOUNTER — Encounter (INDEPENDENT_AMBULATORY_CARE_PROVIDER_SITE_OTHER): Payer: Self-pay | Admitting: Orthopedic Surgery

## 2018-08-15 VITALS — Ht 70.0 in | Wt 190.0 lb

## 2018-08-15 DIAGNOSIS — M23201 Derangement of unspecified lateral meniscus due to old tear or injury, left knee: Secondary | ICD-10-CM | POA: Diagnosis not present

## 2018-08-15 DIAGNOSIS — M25462 Effusion, left knee: Secondary | ICD-10-CM

## 2018-08-19 ENCOUNTER — Encounter (INDEPENDENT_AMBULATORY_CARE_PROVIDER_SITE_OTHER): Payer: Self-pay | Admitting: Orthopedic Surgery

## 2018-08-19 DIAGNOSIS — M25462 Effusion, left knee: Secondary | ICD-10-CM

## 2018-08-19 MED ORDER — LIDOCAINE HCL 1 % IJ SOLN
5.0000 mL | INTRAMUSCULAR | Status: AC | PRN
  Administered 2018-08-19: 5 mL

## 2018-08-19 MED ORDER — METHYLPREDNISOLONE ACETATE 40 MG/ML IJ SUSP
40.0000 mg | INTRAMUSCULAR | Status: AC | PRN
  Administered 2018-08-19: 40 mg via INTRA_ARTICULAR

## 2018-08-19 NOTE — Progress Notes (Signed)
Office Visit Note   Patient: Devin Hines           Date of Birth: 10/01/1966           MRN: [id number] Visit Date: 08/15/2018              Requested by: Catha GosselinLittle, Kevin, MD [address] [place], [place] [address] PCP: Catha GosselinLittle, Kevin, MD  Chief Complaint  Patient presents with  . Left Knee - Follow-up      HPI: Patient is a 52 year old gentleman who presents in follow-up for effusion of the left knee.  Patient's knee was previously injected back in May with aspiration.  Patient presents complaining of increasing pain and swelling with a pain 3-4 over 10.  Assessment & Plan: Visit Diagnoses:  1. 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 Effusion, left knee     Plan: Knee was aspirated and injected he tolerated this well reevaluate at follow-up.  Follow-Up Instructions: Return in about 4 weeks (around 09/12/2018).   Ortho Exam  Patient is alert, oriented, no adenopathy, well-dressed, normal affect, normal respiratory effort. Examination patient has an antalgic gait.  He does have an effusion of the knee and there is no redness no cellulitis no signs of infection medial and lateral joint lines are tender to palpation collaterals and cruciates are stable.  After informed consent the knee was aspirated and injected from the superior lateral portal the effusion was clear.  Imaging: No results found. No images are attached to the encounter.  Labs: No results found for: HGBA1C, ESRSEDRATE, CRP, LABURIC, REPTSTATUS, GRAMSTAIN, CULT, LABORGA   No results found for: ALBUMIN, PREALBUMIN, LABURIC  Body mass index is 27.26 kg/m.  Orders:  Orders Placed This Encounter  Procedures  . MR Knee Left w/o contrast   No orders of the defined types were placed in this encounter.    Procedures: Large Joint Inj: L knee on 08/19/2018 10:33 AM Indications: pain and diagnostic evaluation Details: 22 G 1.5 in needle, superolateral approach  Arthrogram:  No  Medications: 5 mL lidocaine 1 %; 40 mg methylPREDNISolone acetate 40 MG/ML Aspirate: 20 mL clear Outcome: tolerated well, no immediate complications Procedure, treatment alternatives, risks and benefits explained, specific risks discussed. Consent was given by the patient. Immediately prior to procedure a time out was called to verify the correct patient, procedure, equipment, support staff and site/side marked as required. Patient was prepped and draped in the usual sterile fashion.      Clinical Data: No additional findings.  ROS:  All other systems negative, except as noted in the HPI. Review of Systems  Objective: Vital Signs: Ht 5\' 10"  (1.778 m)   Wt 190 lb (86.2 kg)   BMI 27.26 kg/m   Specialty Comments:  No specialty comments available.  PMFS History: Patient Active Problem List   Diagnosis Date Noted  . Effusion, left knee 02/13/2018  . Varicose veins of right lower extremity with complications 04/18/2016   Past Medical History:  Diagnosis Date  . Chronic back pain   . Dyslipidemia   . GERD (gastroesophageal reflux disease)   . HIV positive (HCC)   . Kidney stone   . Varicose veins     Family History  Problem Relation Age of Onset  . Heart disease Mother   . Hypertension Mother     Past Surgical History:  Procedure Laterality Date  . KNEE SURGERY     Social History   Occupational History  .  Not on file  Tobacco Use  . Smoking status: Never Smoker  . Smokeless tobacco: Never Used  Substance and Sexual Activity  . Alcohol use: Yes    Alcohol/week: 0.0 standard drinks    Comment: 10 beers /week  . Drug use: No  . Sexual activity: Not on file

## 2018-08-26 ENCOUNTER — Ambulatory Visit
Admission: RE | Admit: 2018-08-26 | Discharge: 2018-08-26 | Disposition: A | Discharge status: Home / Self Care | Payer: BLUE CROSS/BLUE SHIELD | Source: Ambulatory Visit | Attending: Orthopedic Surgery | Admitting: Orthopedic Surgery

## 2018-08-26 DIAGNOSIS — M23201 Derangement of unspecified lateral meniscus due to old tear or injury, left knee: Secondary | ICD-10-CM

## 2018-08-26 DIAGNOSIS — M25562 Pain in left knee: Secondary | ICD-10-CM | POA: Diagnosis not present

## 2018-08-27 ENCOUNTER — Ambulatory Visit (INDEPENDENT_AMBULATORY_CARE_PROVIDER_SITE_OTHER): Payer: BLUE CROSS/BLUE SHIELD | Admitting: Orthopedic Surgery

## 2018-08-27 ENCOUNTER — Encounter (INDEPENDENT_AMBULATORY_CARE_PROVIDER_SITE_OTHER): Payer: Self-pay | Admitting: Orthopedic Surgery

## 2018-08-27 VITALS — Ht 70.0 in | Wt 190.0 lb

## 2018-08-27 DIAGNOSIS — S83242D Other tear of medial meniscus, current injury, left knee, subsequent encounter: Secondary | ICD-10-CM

## 2018-08-27 DIAGNOSIS — M1712 Unilateral primary osteoarthritis, left knee: Secondary | ICD-10-CM

## 2018-08-27 DIAGNOSIS — M25462 Effusion, left knee: Secondary | ICD-10-CM

## 2018-09-02 ENCOUNTER — Encounter (INDEPENDENT_AMBULATORY_CARE_PROVIDER_SITE_OTHER): Payer: Self-pay | Admitting: Orthopedic Surgery

## 2018-09-02 NOTE — Progress Notes (Signed)
Office Visit Note   Patient: Devin GeneraWillie Zagami Jr.           Date of Birth: 03/18/1966           MRN: [id number] Visit Date: 08/27/2018              Requested by: Catha GosselinLittle, Kevin, MD [address] [place], [place] [address] PCP: Catha GosselinLittle, Kevin, MD  Chief Complaint  Patient presents with  . Left Knee - Follow-up    MRI review      HPI: Patient is a 52 year old gentleman who presents in follow-up for chronic left knee pain with effusion and mechanical symptoms.  Patient complains of catching locking and giving way.  Assessment & Plan: Visit Diagnoses:  1. Other tear of medial meniscus, current injury, left knee, subsequent encounter   2. Unilateral primary osteoarthritis, left knee   3. Effusion, left knee     Plan: Discussed with the patient we could proceed with arthroscopic intervention for debridement of the meniscal tears discussed that with the arthritis he most likely would still have arthritic symptoms.  Risks and benefits of surgery were discussed patient states he understands and wishes to proceed at this time.  Follow-Up Instructions: Return in about 3 weeks (around 09/17/2018).   Ortho Exam  Patient is alert, oriented, no adenopathy, well-dressed, normal affect, normal respiratory effort. Examination patient does have a mild effusion of the left knee collaterals and cruciates are stable.  He is tender to palpation of the medial and lateral joint line.  Review of the MRI scan shows tearing of the posterior horn of the medial meniscus as well as advanced arthritis involving primarily the patellofemoral joint.  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  Imaging: No results found. No images are attached to the encounter.  Labs: No results found for: HGBA1C, ESRSEDRATE, CRP, LABURIC, REPTSTATUS, GRAMSTAIN, CULT, LABORGA   No results found for: ALBUMIN, PREALBUMIN, LABURIC  Body mass index is 27.26 kg/m.  Orders:  No orders of the defined types were placed in  this encounter.  No orders of the defined types were placed in this encounter.    Procedures: No procedures performed  Clinical Data: No additional findings.  ROS:  All other systems negative, except as noted in the HPI. Review of Systems  Objective: Vital Signs: Ht 5\' 10"  (1.778 m)   Wt 190 lb (86.2 kg)   BMI 27.26 kg/m   Specialty Comments:  No specialty comments available.  PMFS History: Patient Active Problem List   Diagnosis Date Noted  . Effusion, left knee 02/13/2018  . Varicose veins of right lower extremity with complications 04/18/2016   Past Medical History:  Diagnosis Date  . Chronic back pain   . Dyslipidemia   . GERD (gastroesophageal reflux disease)   . HIV positive (HCC)   . Kidney stone   . Varicose veins     Family History  Problem Relation Age of Onset  . Heart disease Mother   . Hypertension Mother     Past Surgical History:  Procedure Laterality Date  . KNEE SURGERY     Social History   Occupational History  . Not on file  Tobacco Use  . Smoking status: Never Smoker  . Smokeless tobacco: Never Used  Substance and Sexual Activity  . Alcohol use: Yes    Alcohol/week: 0.0 standard drinks    Comment: 10 beers /week  . Drug use: No  . Sexual activity: Not on file

## 2018-09-03 DIAGNOSIS — M23252 Derangement of posterior horn of lateral meniscus due to old tear or injury, left knee: Secondary | ICD-10-CM | POA: Diagnosis not present

## 2018-09-03 DIAGNOSIS — M6752 Plica syndrome, left knee: Secondary | ICD-10-CM | POA: Diagnosis not present

## 2018-09-03 DIAGNOSIS — S83262A Peripheral tear of lateral meniscus, current injury, left knee, initial encounter: Secondary | ICD-10-CM | POA: Diagnosis not present

## 2018-09-03 DIAGNOSIS — M1732 Unilateral post-traumatic osteoarthritis, left knee: Secondary | ICD-10-CM | POA: Diagnosis not present

## 2018-09-03 DIAGNOSIS — S83212A Bucket-handle tear of medial meniscus, current injury, left knee, initial encounter: Secondary | ICD-10-CM | POA: Diagnosis not present

## 2018-09-03 DIAGNOSIS — M23222 Derangement of posterior horn of medial meniscus due to old tear or injury, left knee: Secondary | ICD-10-CM | POA: Diagnosis not present

## 2018-09-03 DIAGNOSIS — G8918 Other acute postprocedural pain: Secondary | ICD-10-CM | POA: Diagnosis not present

## 2018-09-03 DIAGNOSIS — M659 Synovitis and tenosynovitis, unspecified: Secondary | ICD-10-CM | POA: Diagnosis not present

## 2018-09-10 ENCOUNTER — Encounter (INDEPENDENT_AMBULATORY_CARE_PROVIDER_SITE_OTHER): Payer: Self-pay | Admitting: Orthopedic Surgery

## 2018-09-10 ENCOUNTER — Ambulatory Visit (INDEPENDENT_AMBULATORY_CARE_PROVIDER_SITE_OTHER): Payer: BLUE CROSS/BLUE SHIELD | Admitting: Physician Assistant

## 2018-09-10 VITALS — Ht 70.0 in | Wt 190.0 lb

## 2018-09-10 DIAGNOSIS — M25462 Effusion, left knee: Secondary | ICD-10-CM | POA: Diagnosis not present

## 2018-09-10 DIAGNOSIS — Z9889 Other specified postprocedural states: Secondary | ICD-10-CM

## 2018-09-10 MED ORDER — OXYCODONE-ACETAMINOPHEN 5-325 MG PO TABS: 1.0000 | ORAL_TABLET | Freq: Four times a day (QID) | ORAL | 0 refills | Status: DC | PRN

## 2018-09-10 NOTE — Progress Notes (Signed)
Office Visit Note   Patient: Devin Hines.           Date of Birth: 03-30-1966           MRN: [id number] Visit Date: 09/10/2018              Requested by: Catha Gosselin, MD [address] [place], [place] [address] PCP: Catha Gosselin, MD  Chief Complaint  Patient presents with  . Left Knee - Routine Post Op    09/03/18 left knee scope and debridement       HPI: The patient is a 52 year old gentleman who is seen for follow-up of left knee arthroscopic debridement on 09/03/2018.  He reports that he is had a great deal of swelling and stiffness following surgery.  He has been up walking a great deal and walking without an assistive device.  His knee is quite effused today.  Assessment & Plan: Visit Diagnoses:  1. S/P arthroscopy of left knee   2. Effusion, left knee     Plan: After informed consent the left knee was sterilely prepped and then anesthetized with 1% Xylocaine x1 cc and then aspirated for approximately 60 cc of bloody effusion.  The patient reported marked improvement in his discomfort following the aspiration of the knee.  Counseled the patient to offload the left lower extremity as much as possible and elevate for the next week.  He is not returning to work yet.  Also counseled the patient to apply Ace wrap for compression.  Will plan for suture removal next week he will follow-up next Monday or sooner should he have difficulties in the interim.  Follow-Up Instructions: Return in about 6 days (around 09/16/2018).   Ortho Exam  Patient is alert, oriented, no adenopathy, well-dressed, normal affect, normal respiratory effort. The left knee ports have sutures intact.  He does have a very large effusion and there is some tension over the sutures.  After informed consent the left knee was sterilely prepped and anesthetized with 1% Xylocaine x1 cc and then aspirated for 60 cc of bloody effusion.  The patient tolerated this well and reported good improvement in his  discomfort and stiffness in the knee following the aspiration.  There are no signs of cellulitis no signs of infection.  Imaging: No results found. No images are attached to the encounter.  Labs: No results found for: HGBA1C, ESRSEDRATE, CRP, LABURIC, REPTSTATUS, GRAMSTAIN, CULT, LABORGA   No results found for: ALBUMIN, PREALBUMIN, LABURIC  Body mass index is 27.26 kg/m.  Orders:  No orders of the defined types were placed in this encounter.  Meds ordered this encounter  Medications  . oxyCODONE-acetaminophen (PERCOCET/ROXICET) 5-325 MG tablet    Sig: Take 1 tablet by mouth every 6 (six) hours as needed for moderate pain or severe pain.    Dispense:  28 tablet    Refill:  0     Procedures: Large Joint Inj: L knee on 09/10/2018 3:29 PM Indications: pain and diagnostic evaluation Details: 22 G 1.5 in needle, superolateral approach  Arthrogram: No  Medications: 5 mL lidocaine 1 %; 40 mg methylPREDNISolone acetate 40 MG/ML Aspirate: 60 mL bloody Outcome: tolerated well, no immediate complications Procedure, treatment alternatives, risks and benefits explained, specific risks discussed. Consent was given by the patient. Immediately prior to procedure a time out was called to verify the correct patient, procedure, equipment, support staff and site/side marked as required. Patient was prepped and draped in the usual sterile fashion.  Clinical Data: No additional findings.  ROS:  All other systems negative, except as noted in the HPI. Review of Systems  Objective: Vital Signs: Ht 5\' 10"  (1.778 m)   Wt 190 lb (86.2 kg)   BMI 27.26 kg/m   Specialty Comments:  No specialty comments available.  PMFS History: Patient Active Problem List   Diagnosis Date Noted  . Effusion, left knee 02/13/2018  . Varicose veins of right lower extremity with complications 04/18/2016   Past Medical History:  Diagnosis Date  . Chronic back pain   . Dyslipidemia   . GERD  (gastroesophageal reflux disease)   . HIV positive (HCC)   . Kidney stone   . Varicose veins     Family History  Problem Relation Age of Onset  . Heart disease Mother   . Hypertension Mother     Past Surgical History:  Procedure Laterality Date  . KNEE SURGERY     Social History   Occupational History  . Not on file  Tobacco Use  . Smoking status: Never Smoker  . Smokeless tobacco: Never Used  Substance and Sexual Activity  . Alcohol use: Yes    Alcohol/week: 0.0 standard drinks    Comment: 10 beers /week  . Drug use: No  . Sexual activity: Not on file

## 2018-09-11 MED ORDER — LIDOCAINE HCL 1 % IJ SOLN
5.0000 mL | INTRAMUSCULAR | Status: AC | PRN
  Administered 2018-09-10: 5 mL

## 2018-09-11 MED ORDER — METHYLPREDNISOLONE ACETATE 40 MG/ML IJ SUSP
40.0000 mg | INTRAMUSCULAR | Status: AC | PRN
  Administered 2018-09-10: 40 mg via INTRA_ARTICULAR

## 2018-09-16 ENCOUNTER — Encounter (INDEPENDENT_AMBULATORY_CARE_PROVIDER_SITE_OTHER): Payer: Self-pay | Admitting: Physician Assistant

## 2018-09-16 ENCOUNTER — Ambulatory Visit (INDEPENDENT_AMBULATORY_CARE_PROVIDER_SITE_OTHER): Payer: BLUE CROSS/BLUE SHIELD | Admitting: Physician Assistant

## 2018-09-16 VITALS — Ht 70.0 in | Wt 190.0 lb

## 2018-09-16 DIAGNOSIS — M25462 Effusion, left knee: Secondary | ICD-10-CM

## 2018-09-16 DIAGNOSIS — Z9889 Other specified postprocedural states: Secondary | ICD-10-CM

## 2018-09-16 MED ORDER — DOXYCYCLINE HYCLATE 100 MG PO CAPS: 100.0000 mg | ORAL_CAPSULE | Freq: Two times a day (BID) | ORAL | 0 refills | Status: DC

## 2018-09-16 NOTE — Progress Notes (Signed)
Office Visit Note   Patient: Devin GeneraWillie Arcos Jr.           Date of Birth: 07/11/1966           MRN: [id number] Visit Date: 09/16/2018              Requested by: Catha GosselinLittle, Kevin, MD [address] [place], [place] [address] PCP: Catha GosselinLittle, Kevin, MD  Chief Complaint  Patient presents with  . Left Knee - Routine Post Op    09/03/18 left knee scope and debridement       HPI: The patient is a 52 yo gentleman who is seen for post operative follow up following left knee arthroscopic debridement on 09/03/2018. He had a effusion last visit and we aspirated about 60 cc. He has recurrent effusion today. He is not having much pain, but is stiff from the effusion.   Assessment & Plan: Visit Diagnoses:  1. S/P arthroscopy of left knee   2. Effusion, left knee     Plan: Sutures removed. The left knee was aspirated for 40 cc of serosanguinous fluid by Dr. Lajoyce Cornersuda without difficulty and the patient tolerated this well. He is going to use an Ace wrap for compression to the area to try to help prevent recurrence. He can return to work when he feels ready/comfortable. Follow up in 1 week.   Follow-Up Instructions: Return in about 1 week (around 09/23/2018).   Ortho Exam  Patient is alert, oriented, no adenopathy, well-dressed, normal affect, normal respiratory effort. The left knee has a large recurrent effusion without signs of infection or cellulitis. The knee was sterilely aspirated for 40 cc of serosanguinous fluid by Dr. Lajoyce Cornersuda and the patient tolerated this well. Range of motion of the left knee was much better following aspiration of the effusion.   Imaging: No results found. No images are attached to the encounter.  Labs: No results found for: HGBA1C, ESRSEDRATE, CRP, LABURIC, REPTSTATUS, GRAMSTAIN, CULT, LABORGA   No results found for: ALBUMIN, PREALBUMIN, LABURIC  Body mass index is 27.26 kg/m.  Orders:  Orders Placed This Encounter  Procedures  . Large Joint Inj: L knee   Meds  ordered this encounter  Medications  . doxycycline (VIBRAMYCIN) 100 MG capsule    Sig: Take 1 capsule (100 mg total) by mouth 2 (two) times daily.    Dispense:  14 capsule    Refill:  0     Procedures: Large Joint Inj: L knee on 09/16/2018 10:39 AM Indications: pain and diagnostic evaluation Details: 22 G 1.5 in needle, superolateral approach  Arthrogram: No  Medications: 5 mL lidocaine 1 %; 1 mL lidocaine 1 % Aspirate: 40 mL serous and blood-tinged Outcome: tolerated well, no immediate complications Procedure, treatment alternatives, risks and benefits explained, specific risks discussed. Consent was given by the patient. Immediately prior to procedure a time out was called to verify the correct patient, procedure, equipment, support staff and site/side marked as required. Patient was prepped and draped in the usual sterile fashion.      Clinical Data: No additional findings.  ROS:  All other systems negative, except as noted in the HPI. Review of Systems  Objective: Vital Signs: Ht 5\' 10"  (1.778 m)   Wt 190 lb (86.2 kg)   BMI 27.26 kg/m   Specialty Comments:  No specialty comments available.  PMFS History: Patient Active Problem List   Diagnosis Date Noted  . Effusion, left knee 02/13/2018  . Varicose veins of right lower extremity with complications  04/18/2016   Past Medical History:  Diagnosis Date  . Chronic back pain   . Dyslipidemia   . GERD (gastroesophageal reflux disease)   . HIV positive (HCC)   . Kidney stone   . Varicose veins     Family History  Problem Relation Age of Onset  . Heart disease Mother   . Hypertension Mother     Past Surgical History:  Procedure Laterality Date  . KNEE SURGERY     Social History   Occupational History  . Not on file  Tobacco Use  . Smoking status: Never Smoker  . Smokeless tobacco: Never Used  Substance and Sexual Activity  . Alcohol use: Yes    Alcohol/week: 0.0 standard drinks    Comment: 10 beers  /week  . Drug use: No  . Sexual activity: Not on file

## 2018-09-17 ENCOUNTER — Encounter (INDEPENDENT_AMBULATORY_CARE_PROVIDER_SITE_OTHER): Payer: Self-pay | Admitting: Physician Assistant

## 2018-09-17 MED ORDER — LIDOCAINE HCL 1 % IJ SOLN
5.0000 mL | INTRAMUSCULAR | Status: AC | PRN
  Administered 2018-09-16: 5 mL

## 2018-09-17 MED ORDER — LIDOCAINE HCL 1 % IJ SOLN
1.0000 mL | INTRAMUSCULAR | Status: AC | PRN
  Administered 2018-09-16: 1 mL

## 2018-09-18 ENCOUNTER — Telehealth (INDEPENDENT_AMBULATORY_CARE_PROVIDER_SITE_OTHER): Payer: Self-pay | Admitting: Orthopedic Surgery

## 2018-09-18 ENCOUNTER — Other Ambulatory Visit (INDEPENDENT_AMBULATORY_CARE_PROVIDER_SITE_OTHER): Payer: Self-pay

## 2018-09-18 NOTE — Telephone Encounter (Signed)
E-mail provided  [email or web address]  Patient called his employer needs a work note stating if he has any restrictions. Please e-mail work note

## 2018-09-18 NOTE — Telephone Encounter (Signed)
I called and spoke to patient about picking up his letter from the front office.

## 2018-09-18 NOTE — Telephone Encounter (Signed)
Can you call?

## 2018-09-18 NOTE — Telephone Encounter (Signed)
I called and spoke to patient and informed him that his note will be at the front office for pickup for him tomorrow.

## 2018-09-23 ENCOUNTER — Ambulatory Visit (INDEPENDENT_AMBULATORY_CARE_PROVIDER_SITE_OTHER): Payer: BLUE CROSS/BLUE SHIELD | Admitting: Physician Assistant

## 2018-09-23 ENCOUNTER — Encounter (INDEPENDENT_AMBULATORY_CARE_PROVIDER_SITE_OTHER): Payer: Self-pay | Admitting: Physician Assistant

## 2018-09-23 VITALS — Ht 70.0 in | Wt 190.0 lb

## 2018-09-23 DIAGNOSIS — Z9889 Other specified postprocedural states: Secondary | ICD-10-CM

## 2018-09-23 DIAGNOSIS — M25462 Effusion, left knee: Secondary | ICD-10-CM

## 2018-09-23 MED ORDER — LIDOCAINE HCL 1 % IJ SOLN
1.0000 mL | INTRAMUSCULAR | Status: AC | PRN
  Administered 2018-09-23: 1 mL

## 2018-09-23 MED ORDER — DOXYCYCLINE HYCLATE 100 MG PO CAPS: 100.0000 mg | ORAL_CAPSULE | Freq: Two times a day (BID) | ORAL | 0 refills | Status: AC

## 2018-09-23 MED ORDER — OXYCODONE-ACETAMINOPHEN 5-325 MG PO TABS: 1.0000 | ORAL_TABLET | Freq: Four times a day (QID) | ORAL | 0 refills | Status: DC | PRN

## 2018-09-23 NOTE — Progress Notes (Addendum)
Office Visit Note   Patient: Devin GeneraWillie Hitchner Jr.           Date of Birth: 11/30/1965           MRN: [id number] Visit Date: 09/23/2018              Requested by: Catha GosselinLittle, Kevin, MD [address] [place], [place] [address] PCP: Catha GosselinLittle, Kevin, MD  Chief Complaint  Patient presents with  . Left Knee - Routine Post Op    09/03/18 left knee scope and debridement       HPI: The patient is a 52 yo gentleman who is seen for post operative follow up following left knee arthroscopic debridement 09/03/2018. He has developed recurrent post operative effusion and required aspiration x 2. He has recurrent effusion today. He reports moderate pain as the fluid builds up over the knee.   Assessment & Plan: Visit Diagnoses:  1. S/P arthroscopy of left knee   2. Effusion, left knee     Plan: The left knee was aspirated under sterile techniques and the patient tolerated this well. Continue Doxycycline 100 mg BID and refilled Percocet this visit.  He will follow up next week .   Follow-Up Instructions: Return in about 8 days (around 10/01/2018).   Ortho Exam  Patient is alert, oriented, no adenopathy, well-dressed, normal affect, normal respiratory effort. The left knee recurrent effusion. No signs of infection or cellulitis. Effusion aspirated after informed consent for 60 cc of serous fluid. No signs of infection of the fluid.  No distal edema.   Imaging: No results found. No images are attached to the encounter.  Labs: No results found for: HGBA1C, ESRSEDRATE, CRP, LABURIC, REPTSTATUS, GRAMSTAIN, CULT, LABORGA   No results found for: ALBUMIN, PREALBUMIN, LABURIC  Body mass index is 27.26 kg/m.  Orders:  No orders of the defined types were placed in this encounter.  Meds ordered this encounter  Medications  . oxyCODONE-acetaminophen (PERCOCET/ROXICET) 5-325 MG tablet    Sig: Take 1 tablet by mouth every 6 (six) hours as needed for moderate pain or severe pain.    Dispense:  28  tablet    Refill:  0  . doxycycline (VIBRAMYCIN) 100 MG capsule    Sig: Take 1 capsule (100 mg total) by mouth 2 (two) times daily.    Dispense:  14 capsule    Refill:  0     Procedures: Large Joint Inj: L knee on 09/23/2018 9:13 AM Indications: pain and diagnostic evaluation Details: 18 G 1.5 in needle, superolateral approach  Arthrogram: No  Medications: 1 mL lidocaine 1 % Aspirate: 60 mL serous, yellow and clear Outcome: tolerated well, no immediate complications Procedure, treatment alternatives, risks and benefits explained, specific risks discussed. Consent was given by the patient. Immediately prior to procedure a time out was called to verify the correct patient, procedure, equipment, support staff and site/side marked as required. Patient was prepped and draped in the usual sterile fashion.      Clinical Data: No additional findings.  ROS:  All other systems negative, except as noted in the HPI. Review of Systems  Objective: Vital Signs: Ht 5\' 10"  (1.778 m)   Wt 190 lb (86.2 kg)   BMI 27.26 kg/m   Specialty Comments:  No specialty comments available.  PMFS History: Patient Active Problem List   Diagnosis Date Noted  . Effusion, left knee 02/13/2018  . Varicose veins of right lower extremity with complications 04/18/2016   Past Medical History:  Diagnosis  Date  . Chronic back pain   . Dyslipidemia   . GERD (gastroesophageal reflux disease)   . HIV positive (HCC)   . Kidney stone   . Varicose veins     Family History  Problem Relation Age of Onset  . Heart disease Mother   . Hypertension Mother     Past Surgical History:  Procedure Laterality Date  . KNEE SURGERY     Social History   Occupational History  . Not on file  Tobacco Use  . Smoking status: Never Smoker  . Smokeless tobacco: Never Used  Substance and Sexual Activity  . Alcohol use: Yes    Alcohol/week: 0.0 standard drinks    Comment: 10 beers /week  . Drug use: No  . Sexual  activity: Not on file

## 2018-10-01 ENCOUNTER — Ambulatory Visit (INDEPENDENT_AMBULATORY_CARE_PROVIDER_SITE_OTHER): Payer: BLUE CROSS/BLUE SHIELD | Admitting: Physician Assistant

## 2018-10-01 ENCOUNTER — Encounter (INDEPENDENT_AMBULATORY_CARE_PROVIDER_SITE_OTHER): Payer: Self-pay | Admitting: Physician Assistant

## 2018-10-01 VITALS — Ht 70.0 in | Wt 190.0 lb

## 2018-10-01 DIAGNOSIS — M25462 Effusion, left knee: Secondary | ICD-10-CM | POA: Diagnosis not present

## 2018-10-01 DIAGNOSIS — Z9889 Other specified postprocedural states: Secondary | ICD-10-CM

## 2018-10-01 DIAGNOSIS — S83242D Other tear of medial meniscus, current injury, left knee, subsequent encounter: Secondary | ICD-10-CM

## 2018-10-01 MED ORDER — LIDOCAINE HCL 1 % IJ SOLN
1.0000 mL | INTRAMUSCULAR | Status: AC | PRN
  Administered 2018-10-01: 1 mL

## 2018-10-01 NOTE — Progress Notes (Signed)
Office Visit Note   Patient: Devin GeneraWillie Duclos Jr.           Date of Birth: 02/17/1966           MRN: [id number] Visit Date: 10/01/2018              Requested by: Catha GosselinLittle, Kevin, MD [address] [place], [place] [address] PCP: Catha GosselinLittle, Kevin, MD  Chief Complaint  Patient presents with  . Left Knee - Routine Post Op      HPI: The patient is a 52 yo gentleman seen for post operative follow up following a left knee arthroscopic debridement 09/03/2018. He developed recurrent effusions post operatively and returns today with a small effusion. He reports little pain and has returned to work.   Assessment & Plan: Visit Diagnoses:  1. S/P arthroscopy of left knee   2. Effusion, left knee   3. Other tear of medial meniscus, current injury, left knee, subsequent encounter     Plan: Attempted aspiration of small effusion over the left knee today without success. Discussed follow up next week or earlier if effusion recurs.   Follow-Up Instructions: Return in about 1 week (around 10/08/2018).   Ortho Exam  Patient is alert, oriented, no adenopathy, well-dressed, normal affect, normal respiratory effort. Left knee with mild effusion today. Good range of motion and pain free range of motion. No signs of infection. Port sites well healed.  Imaging: No results found. No images are attached to the encounter.  Labs: No results found for: HGBA1C, ESRSEDRATE, CRP, LABURIC, REPTSTATUS, GRAMSTAIN, CULT, LABORGA   No results found for: ALBUMIN, PREALBUMIN, LABURIC  Body mass index is 27.26 kg/m.  Orders:  Orders Placed This Encounter  Procedures  . Large Joint Inj: L knee   No orders of the defined types were placed in this encounter.    Procedures: Large Joint Inj: L knee on 10/01/2018 8:59 AM Indications: pain and diagnostic evaluation Details: 22 G 1.5 in needle, superolateral approach  Arthrogram: No  Medications: 1 mL lidocaine 1 % Aspirate: 0 mL Outcome: tolerated well, no  immediate complications Procedure, treatment alternatives, risks and benefits explained, specific risks discussed. Consent was given by the patient. Immediately prior to procedure a time out was called to verify the correct patient, procedure, equipment, support staff and site/side marked as required. Patient was prepped and draped in the usual sterile fashion.      Clinical Data: No additional findings.  ROS:  All other systems negative, except as noted in the HPI. Review of Systems  Objective: Vital Signs: Ht 5\' 10"  (1.778 m)   Wt 190 lb (86.2 kg)   BMI 27.26 kg/m   Specialty Comments:  No specialty comments available.  PMFS History: Patient Active Problem List   Diagnosis Date Noted  . Effusion, left knee 02/13/2018  . Varicose veins of right lower extremity with complications 04/18/2016   Past Medical History:  Diagnosis Date  . Chronic back pain   . Dyslipidemia   . GERD (gastroesophageal reflux disease)   . HIV positive (HCC)   . Kidney stone   . Varicose veins     Family History  Problem Relation Age of Onset  . Heart disease Mother   . Hypertension Mother     Past Surgical History:  Procedure Laterality Date  . KNEE SURGERY     Social History   Occupational History  . Not on file  Tobacco Use  . Smoking status: Never Smoker  . Smokeless tobacco:  Never Used  Substance and Sexual Activity  . Alcohol use: Yes    Alcohol/week: 0.0 standard drinks    Comment: 10 beers /week  . Drug use: No  . Sexual activity: Not on file

## 2018-10-08 ENCOUNTER — Ambulatory Visit (INDEPENDENT_AMBULATORY_CARE_PROVIDER_SITE_OTHER): Payer: BLUE CROSS/BLUE SHIELD | Admitting: Physician Assistant

## 2018-10-08 ENCOUNTER — Encounter (INDEPENDENT_AMBULATORY_CARE_PROVIDER_SITE_OTHER): Payer: Self-pay | Admitting: Physician Assistant

## 2018-10-08 VITALS — Ht 70.0 in | Wt 190.0 lb

## 2018-10-08 DIAGNOSIS — S83242D Other tear of medial meniscus, current injury, left knee, subsequent encounter: Secondary | ICD-10-CM

## 2018-10-08 DIAGNOSIS — M25462 Effusion, left knee: Secondary | ICD-10-CM

## 2018-10-08 DIAGNOSIS — Z9889 Other specified postprocedural states: Secondary | ICD-10-CM

## 2018-10-08 NOTE — Progress Notes (Signed)
   Office Visit Note   Patient: Devin GeneraWillie Ferencz Jr.           Date of Birth: 12/06/1965           MRN: [id number] Visit Date: 10/08/2018              Requested by: Catha GosselinLittle, Kevin, MD [address] [place], [place] [address] PCP: Catha GosselinLittle, Kevin, MD  Chief Complaint  Patient presents with  . Left Knee - Routine Post Op      HPI: The patient is a 53 yo gentleman seen for post operative follow up following left knee arthroscopic debridement 09/03/2018. He developed recurrent effusions post operatively. He has returned to work and is having minimal rare pain. He notes a small amount of fluid over the left lateral knee area.     Assessment & Plan: Visit Diagnoses:  1. S/P arthroscopy of left knee   2. Effusion, left knee   3. Other tear of medial meniscus, current injury, left knee, subsequent encounter     Plan: Counseled to continue activities to tolerance. Will have him follow up in 2 weeks or sooner if problems in the interim.   Follow-Up Instructions: Return in about 2 weeks (around 10/22/2018).   Ortho Exam  Patient is alert, oriented, no adenopathy, well-dressed, normal affect, normal respiratory effort. The left knee has a mild lateral effusion but good range of motion 0/115 degrees and pain free range of motion. No instability. No signs of infection or cellulitis.   Imaging: No results found. No images are attached to the encounter.  Labs: No results found for: HGBA1C, ESRSEDRATE, CRP, LABURIC, REPTSTATUS, GRAMSTAIN, CULT, LABORGA   No results found for: ALBUMIN, PREALBUMIN, LABURIC  Body mass index is 27.26 kg/m.  Orders:  No orders of the defined types were placed in this encounter.  No orders of the defined types were placed in this encounter.    Procedures: No procedures performed  Clinical Data: No additional findings.  ROS:  All other systems negative, except as noted in the HPI. Review of Systems  Objective: Vital Signs: Ht 5\' 10"  (1.778 m)    Wt 190 lb (86.2 kg)   BMI 27.26 kg/m   Specialty Comments:  No specialty comments available.  PMFS History: Patient Active Problem List   Diagnosis Date Noted  . Effusion, left knee 02/13/2018  . Varicose veins of right lower extremity with complications 04/18/2016   Past Medical History:  Diagnosis Date  . Chronic back pain   . Dyslipidemia   . GERD (gastroesophageal reflux disease)   . HIV positive (HCC)   . Kidney stone   . Varicose veins     Family History  Problem Relation Age of Onset  . Heart disease Mother   . Hypertension Mother     Past Surgical History:  Procedure Laterality Date  . KNEE SURGERY     Social History   Occupational History  . Not on file  Tobacco Use  . Smoking status: Never Smoker  . Smokeless tobacco: Never Used  Substance and Sexual Activity  . Alcohol use: Yes    Alcohol/week: 0.0 standard drinks    Comment: 10 beers /week  . Drug use: No  . Sexual activity: Not on file

## 2018-10-22 ENCOUNTER — Encounter (INDEPENDENT_AMBULATORY_CARE_PROVIDER_SITE_OTHER): Payer: Self-pay | Admitting: Physician Assistant

## 2018-10-22 ENCOUNTER — Ambulatory Visit (INDEPENDENT_AMBULATORY_CARE_PROVIDER_SITE_OTHER): Payer: BLUE CROSS/BLUE SHIELD | Admitting: Physician Assistant

## 2018-10-22 VITALS — Ht 70.0 in | Wt 190.0 lb

## 2018-10-22 DIAGNOSIS — S83242D Other tear of medial meniscus, current injury, left knee, subsequent encounter: Secondary | ICD-10-CM

## 2018-10-22 DIAGNOSIS — Z9889 Other specified postprocedural states: Secondary | ICD-10-CM

## 2018-10-22 DIAGNOSIS — M25462 Effusion, left knee: Secondary | ICD-10-CM | POA: Diagnosis not present

## 2018-10-22 DIAGNOSIS — M1712 Unilateral primary osteoarthritis, left knee: Secondary | ICD-10-CM

## 2018-10-22 MED ORDER — OXYCODONE-ACETAMINOPHEN 5-325 MG PO TABS: 1.0000 | ORAL_TABLET | Freq: Four times a day (QID) | ORAL | 0 refills | Status: DC | PRN

## 2018-10-22 MED ORDER — LIDOCAINE HCL 1 % IJ SOLN
1.0000 mL | INTRAMUSCULAR | Status: AC | PRN
  Administered 2018-10-22: 1 mL

## 2018-10-22 NOTE — Progress Notes (Signed)
Office Visit Note   Patient: Devin Hines.           Date of Birth: [birth year]           MRN: [id number] Visit Date: 10/22/2018              Requested by: Catha Gosselin, MD [address] [place], [place] [address] PCP: Catha Gosselin, MD  Chief Complaint  Patient presents with  . Left Knee - Follow-up      HPI: The patient is a 53 year old gentleman who is seen for postoperative follow-up following left knee arthroscopic debridement on 09/03/2018.  He has returned to work.  He started working out again last week and reports the following a long walk and some mild jogging he developed a significant recurrent effusion.  Assessment & Plan: Visit Diagnoses:  1. S/P arthroscopy of left knee   2. Effusion, left knee   3. Other tear of medial meniscus, current injury, left knee, subsequent encounter   4. Unilateral primary osteoarthritis, left knee     Plan: After informed consent the left knee was aspirated for 50cc serous appearing clear fluid.  This was done under sterile techniques and the patient tolerated the procedure well.  He well minimize jarring or pounding activities over the knee such as jogging.  He can do some stationary bicycle.  He is anxious to return to working out and we discussed that we may need to do this more gradually.  He was given a small amount of Percocet as he states the pain was fairly severe when the effusion recurred.  Cautioned to use the Percocet only when he stopped working or driving.  He will follow-up here in 2 weeks.  Follow-Up Instructions: Return in about 2 weeks (around 11/05/2018).   Ortho Exam  Patient is alert, oriented, no adenopathy, well-dressed, normal affect, normal respiratory effort. The left knee has a large recurrent effusion.  There are no signs of cellulitis no signs of infection with no erythema or increased warmth.  There is full range of motion.  After informed consent the left knee was sterilely aspirated for 50 cc of  serous fluid and the patient tolerated this well.  He reports improvement following the aspiration of the knee.  Imaging: No results found. No images are attached to the encounter.  Labs: No results found for: HGBA1C, ESRSEDRATE, CRP, LABURIC, REPTSTATUS, GRAMSTAIN, CULT, LABORGA   No results found for: ALBUMIN, PREALBUMIN, LABURIC  Body mass index is 27.26 kg/m.  Orders:  Orders Placed This Encounter  Procedures  . Large Joint Inj   Meds ordered this encounter  Medications  . oxyCODONE-acetaminophen (PERCOCET/ROXICET) 5-325 MG tablet    Sig: Take 1 tablet by mouth every 6 (six) hours as needed for moderate pain or severe pain.    Dispense:  28 tablet    Refill:  0     Procedures: Large Joint Inj: L knee on 10/22/2018 9:04 AM Indications: pain and diagnostic evaluation Details: 18 G 1.5 in needle, superolateral approach  Arthrogram: No  Medications: 1 mL lidocaine 1 % Aspirate: 50 mL yellow and serous Outcome: tolerated well, no immediate complications Procedure, treatment alternatives, risks and benefits explained, specific risks discussed. Consent was given by the patient. Immediately prior to procedure a time out was called to verify the correct patient, procedure, equipment, support staff and site/side marked as required. Patient was prepped and draped in the usual sterile fashion.      Clinical Data: No additional findings.  ROS:  All other systems negative, except as noted in the HPI. Review of Systems  Objective: Vital Signs: Ht 5\' 10"  (1.778 m)   Wt 190 lb (86.2 kg)   BMI 27.26 kg/m   Specialty Comments:  No specialty comments available.  PMFS History: Patient Active Problem List   Diagnosis Date Noted  . Effusion, left knee 02/13/2018  . Varicose veins of right lower extremity with complications 04/18/2016   Past Medical History:  Diagnosis Date  . Chronic back pain   . Dyslipidemia   . GERD (gastroesophageal reflux disease)   . HIV  positive (HCC)   . Kidney stone   . Varicose veins     Family History  Problem Relation Age of Onset  . Heart disease Mother   . Hypertension Mother     Past Surgical History:  Procedure Laterality Date  . KNEE SURGERY     Social History   Occupational History  . Not on file  Tobacco Use  . Smoking status: Never Smoker  . Smokeless tobacco: Never Used  Substance and Sexual Activity  . Alcohol use: Yes    Alcohol/week: 0.0 standard drinks    Comment: 10 beers /week  . Drug use: No  . Sexual activity: Not on file

## 2018-11-05 ENCOUNTER — Encounter (INDEPENDENT_AMBULATORY_CARE_PROVIDER_SITE_OTHER): Payer: Self-pay | Admitting: Physician Assistant

## 2018-11-05 ENCOUNTER — Ambulatory Visit (INDEPENDENT_AMBULATORY_CARE_PROVIDER_SITE_OTHER): Payer: BLUE CROSS/BLUE SHIELD | Admitting: Physician Assistant

## 2018-11-05 VITALS — Ht 70.0 in | Wt 190.0 lb

## 2018-11-05 DIAGNOSIS — M25462 Effusion, left knee: Secondary | ICD-10-CM | POA: Diagnosis not present

## 2018-11-05 DIAGNOSIS — S83242D Other tear of medial meniscus, current injury, left knee, subsequent encounter: Secondary | ICD-10-CM

## 2018-11-05 DIAGNOSIS — M1712 Unilateral primary osteoarthritis, left knee: Secondary | ICD-10-CM

## 2018-11-05 DIAGNOSIS — Z9889 Other specified postprocedural states: Secondary | ICD-10-CM

## 2018-11-05 MED ORDER — LIDOCAINE HCL 1 % IJ SOLN
5.0000 mL | INTRAMUSCULAR | Status: AC | PRN
  Administered 2018-11-05: 5 mL

## 2018-11-05 MED ORDER — METHYLPREDNISOLONE ACETATE 40 MG/ML IJ SUSP
40.0000 mg | INTRAMUSCULAR | Status: AC | PRN
  Administered 2018-11-05: 40 mg via INTRA_ARTICULAR

## 2018-11-05 NOTE — Progress Notes (Signed)
Office Visit Note   Patient: Devin Hines.           Date of Birth: 1966/08/07           MRN: [id number] Visit Date: 11/05/2018              Requested by: Catha Gosselin, MD [address] [place], [place] [address] PCP: Catha Gosselin, MD  Chief Complaint  Patient presents with  . Left Knee - Routine Post Op    09/03/18 Left knee Arthro & Deb Aspirated 10/22/2018 F/U      HPI: The patient is a 53 year old gentleman who is seen for postoperative follow-up following left knee arthroscopic debridement on 09/03/2018.  He is continued to have recurrent effusions of the left knee without signs of infection or cellulitis.  He had attempted to return to exercising with jogging and developed recurrent effusion and has since lightened his activities but returns today with another effusion.  Assessment & Plan: Visit Diagnoses:  1. S/P arthroscopy of left knee   2. Effusion, left knee   3. Other tear of medial meniscus, current injury, left knee, subsequent encounter   4. Unilateral primary osteoarthritis, left knee     Plan: after informed consent the patient's left knee was aspirated under sterile techniques for 45 cc of clear yellow serous fluid and then injected with lidocaine and steroid and the patient tolerated this well. He was advised to continue to minimize activity and follow up with Dr. Lajoyce Corners in about 2 weeks.    Follow-Up Instructions: Return in about 2 weeks (around 11/19/2018) for Dr. Lajoyce Corners.   Ortho Exam  Patient is alert, oriented, no adenopathy, well-dressed, normal affect, normal respiratory effort. The patient's left knee has a recurrent moderate effusion.  After sterile prep the patient's knee was aspirated for 25 cc of clear yellow serous appearing fluid.  There are no signs of cellulitis or infection.  Patient tolerated this procedure well.  He has good knee range of motion with full extension and 120 degrees of flexion which is improved following the knee aspiration.   He is neurovascularly intact.  Imaging: No results found. No images are attached to the encounter.  Labs: No results found for: HGBA1C, ESRSEDRATE, CRP, LABURIC, REPTSTATUS, GRAMSTAIN, CULT, LABORGA   No results found for: ALBUMIN, PREALBUMIN, LABURIC  Body mass index is 27.26 kg/m.  Orders:  No orders of the defined types were placed in this encounter.  No orders of the defined types were placed in this encounter.    Procedures: Large Joint Inj: L knee on 11/05/2018 8:46 AM Indications: pain and diagnostic evaluation Details: 22 G 1.5 in needle, superolateral approach  Arthrogram: No  Medications: 5 mL lidocaine 1 %; 40 mg methylPREDNISolone acetate 40 MG/ML Aspirate: 45 mL yellow, clear and serous Outcome: tolerated well, no immediate complications Procedure, treatment alternatives, risks and benefits explained, specific risks discussed. Consent was given by the patient. Immediately prior to procedure a time out was called to verify the correct patient, procedure, equipment, support staff and site/side marked as required. Patient was prepped and draped in the usual sterile fashion.      Clinical Data: No additional findings.  ROS:  All other systems negative, except as noted in the HPI. Review of Systems  Objective: Vital Signs: Ht 5\' 10"  (1.778 m)   Wt 190 lb (86.2 kg)   BMI 27.26 kg/m   Specialty Comments:  No specialty comments available.  PMFS History: Patient Active Problem List  Diagnosis Date Noted  . Effusion, left knee 02/13/2018  . Varicose veins of right lower extremity with complications 04/18/2016   Past Medical History:  Diagnosis Date  . Chronic back pain   . Dyslipidemia   . GERD (gastroesophageal reflux disease)   . HIV positive (HCC)   . Kidney stone   . Varicose veins     Family History  Problem Relation Age of Onset  . Heart disease Mother   . Hypertension Mother     Past Surgical History:  Procedure Laterality Date  .  KNEE SURGERY     Social History   Occupational History  . Not on file  Tobacco Use  . Smoking status: Never Smoker  . Smokeless tobacco: Never Used  Substance and Sexual Activity  . Alcohol use: Yes    Alcohol/week: 0.0 standard drinks    Comment: 10 beers /week  . Drug use: No  . Sexual activity: Not on file

## 2018-11-13 DIAGNOSIS — G4700 Insomnia, unspecified: Secondary | ICD-10-CM | POA: Diagnosis not present

## 2018-11-13 DIAGNOSIS — Z113 Encounter for screening for infections with a predominantly sexual mode of transmission: Secondary | ICD-10-CM | POA: Diagnosis not present

## 2018-11-13 DIAGNOSIS — I10 Essential (primary) hypertension: Secondary | ICD-10-CM | POA: Diagnosis not present

## 2018-11-13 DIAGNOSIS — B20 Human immunodeficiency virus [HIV] disease: Secondary | ICD-10-CM | POA: Diagnosis not present

## 2018-11-13 DIAGNOSIS — K219 Gastro-esophageal reflux disease without esophagitis: Secondary | ICD-10-CM | POA: Diagnosis not present

## 2018-11-13 DIAGNOSIS — Z21 Asymptomatic human immunodeficiency virus [HIV] infection status: Secondary | ICD-10-CM | POA: Diagnosis not present

## 2018-11-21 ENCOUNTER — Ambulatory Visit (INDEPENDENT_AMBULATORY_CARE_PROVIDER_SITE_OTHER): Payer: BLUE CROSS/BLUE SHIELD | Admitting: Orthopedic Surgery

## 2018-11-21 ENCOUNTER — Encounter (INDEPENDENT_AMBULATORY_CARE_PROVIDER_SITE_OTHER): Payer: Self-pay | Admitting: Orthopedic Surgery

## 2018-11-21 VITALS — Ht 70.0 in | Wt 190.0 lb

## 2018-11-21 DIAGNOSIS — M25462 Effusion, left knee: Secondary | ICD-10-CM | POA: Diagnosis not present

## 2018-11-21 DIAGNOSIS — Z9889 Other specified postprocedural states: Secondary | ICD-10-CM

## 2018-11-21 DIAGNOSIS — S83242D Other tear of medial meniscus, current injury, left knee, subsequent encounter: Secondary | ICD-10-CM

## 2018-11-23 ENCOUNTER — Encounter (INDEPENDENT_AMBULATORY_CARE_PROVIDER_SITE_OTHER): Payer: Self-pay | Admitting: Orthopedic Surgery

## 2018-11-23 DIAGNOSIS — M25462 Effusion, left knee: Secondary | ICD-10-CM | POA: Diagnosis not present

## 2018-11-23 MED ORDER — METHYLPREDNISOLONE ACETATE 40 MG/ML IJ SUSP
40.0000 mg | INTRAMUSCULAR | Status: AC | PRN
  Administered 2018-11-23: 40 mg via INTRA_ARTICULAR

## 2018-11-23 MED ORDER — LIDOCAINE HCL 1 % IJ SOLN
5.0000 mL | INTRAMUSCULAR | Status: AC | PRN
  Administered 2018-11-23: 5 mL

## 2018-11-23 NOTE — Progress Notes (Signed)
Office Visit Note   Patient: Devin Hines.           Date of Birth: 02/23/1966           MRN: [id number] Visit Date: 11/21/2018              Requested by: Catha Gosselin, MD [address] [place], [place] [address] PCP: Catha Gosselin, MD  Chief Complaint  Patient presents with  . Left Knee - Follow-up      HPI: Patient is a 53 year old gentleman status post arthroscopic debridement left knee with osteochondral defects of the medial joint line as well as a degenerative meniscal tear.  Patient has had recurrent effusions and presents at this time with recurrent effusion.  Assessment & Plan: Visit Diagnoses:  1. Effusion, left knee   2. S/P arthroscopy of left knee   3. Other tear of medial meniscus, current injury, left knee, subsequent encounter     Plan: Knee was aspirated no clinical signs of infection or gout.  Continue with strengthening with isometric straight leg raises.  Follow-Up Instructions: Return in about 4 weeks (around 12/19/2018).   Ortho Exam  Patient is alert, oriented, no adenopathy, well-dressed, normal affect, normal respiratory effort. Examination patient does have some quad atrophy there is a large effusion of his knee there is no redness no cellulitis no tenderness to palpation no clinical signs of infection or gout.  Knee was aspirated from the superior lateral portal 90 cc of clear synovial fluid  Imaging: No results found. No images are attached to the encounter.  Labs: No results found for: HGBA1C, ESRSEDRATE, CRP, LABURIC, REPTSTATUS, GRAMSTAIN, CULT, LABORGA   No results found for: ALBUMIN, PREALBUMIN, LABURIC  Body mass index is 27.26 kg/m.  Orders:  No orders of the defined types were placed in this encounter.  No orders of the defined types were placed in this encounter.    Procedures: Large Joint Inj: L knee on 11/23/2018 2:17 PM Indications: pain and diagnostic evaluation Details: 22 G 1.5 in needle, superolateral  approach  Arthrogram: No  Medications: 5 mL lidocaine 1 %; 40 mg methylPREDNISolone acetate 40 MG/ML Aspirate: 90 mL clear Outcome: tolerated well, no immediate complications Procedure, treatment alternatives, risks and benefits explained, specific risks discussed. Consent was given by the patient. Immediately prior to procedure a time out was called to verify the correct patient, procedure, equipment, support staff and site/side marked as required. Patient was prepped and draped in the usual sterile fashion.      Clinical Data: No additional findings.  ROS:  All other systems negative, except as noted in the HPI. Review of Systems  Objective: Vital Signs: Ht 5\' 10"  (1.778 m)   Wt 190 lb (86.2 kg)   BMI 27.26 kg/m   Specialty Comments:  No specialty comments available.  PMFS History: Patient Active Problem List   Diagnosis Date Noted  . Effusion, left knee 02/13/2018  . Varicose veins of right lower extremity with complications 04/18/2016   Past Medical History:  Diagnosis Date  . Chronic back pain   . Dyslipidemia   . GERD (gastroesophageal reflux disease)   . HIV positive (HCC)   . Kidney stone   . Varicose veins     Family History  Problem Relation Age of Onset  . Heart disease Mother   . Hypertension Mother     Past Surgical History:  Procedure Laterality Date  . KNEE SURGERY     Social History   Occupational History  .  Not on file  Tobacco Use  . Smoking status: Never Smoker  . Smokeless tobacco: Never Used  Substance and Sexual Activity  . Alcohol use: Yes    Alcohol/week: 0.0 standard drinks    Comment: 10 beers /week  . Drug use: No  . Sexual activity: Not on file

## 2018-12-12 ENCOUNTER — Encounter (INDEPENDENT_AMBULATORY_CARE_PROVIDER_SITE_OTHER): Payer: Self-pay | Admitting: Orthopedic Surgery

## 2018-12-12 ENCOUNTER — Other Ambulatory Visit: Payer: Self-pay

## 2018-12-12 ENCOUNTER — Ambulatory Visit (INDEPENDENT_AMBULATORY_CARE_PROVIDER_SITE_OTHER): Payer: BLUE CROSS/BLUE SHIELD | Admitting: Orthopedic Surgery

## 2018-12-12 VITALS — Ht 70.0 in | Wt 190.0 lb

## 2018-12-12 DIAGNOSIS — M25562 Pain in left knee: Secondary | ICD-10-CM | POA: Diagnosis not present

## 2018-12-12 DIAGNOSIS — M25462 Effusion, left knee: Secondary | ICD-10-CM

## 2018-12-12 DIAGNOSIS — Z9889 Other specified postprocedural states: Secondary | ICD-10-CM

## 2018-12-12 MED ORDER — LIDOCAINE HCL 1 % IJ SOLN
5.0000 mL | INTRAMUSCULAR | Status: AC | PRN
  Administered 2018-12-12: 5 mL

## 2018-12-12 MED ORDER — METHYLPREDNISOLONE ACETATE 40 MG/ML IJ SUSP
40.0000 mg | INTRAMUSCULAR | Status: AC | PRN
  Administered 2018-12-12: 40 mg via INTRA_ARTICULAR

## 2018-12-12 MED ORDER — OXYCODONE-ACETAMINOPHEN 5-325 MG PO TABS: 1.0000 | ORAL_TABLET | Freq: Four times a day (QID) | ORAL | 0 refills | Status: AC | PRN

## 2018-12-12 NOTE — Progress Notes (Signed)
Office Visit Note   Patient: Devin Hines.           Date of Birth: 04-20-1966           MRN: [id number] Visit Date: 12/12/2018              Requested by: Catha Gosselin, MD [address] [place], [place] [address] PCP: Catha Gosselin, MD  Chief Complaint  Patient presents with  . Left Knee - Follow-up, Edema    Aspiration & Cortisone inj      HPI: Patient is a 53 year old gentleman who presents in follow-up status post left knee arthroscopic debridement he has had recurrent effusions patient states that he has severe pain he states he is started exercising.  He denies any fever or chills.  Assessment & Plan: Visit Diagnoses:  1. Effusion, left knee     Plan: Knee was aspirated recommended cycling for strengthening and improving nutrition for the cartilage.  A prescription was sent in for Percocet.  Follow-Up Instructions: Return in about 3 weeks (around 01/02/2019).   Ortho Exam  Patient is alert, oriented, no adenopathy, well-dressed, normal affect, normal respiratory effort. Examination patient does have a moderate effusion there is no redness no cellulitis no pain with range of motion of the knee.  The knee was aspirated of 60 cc of clear synovial fluid.  Knee was injected.  Imaging: No results found. No images are attached to the encounter.  Labs: No results found for: HGBA1C, ESRSEDRATE, CRP, LABURIC, REPTSTATUS, GRAMSTAIN, CULT, LABORGA   No results found for: ALBUMIN, PREALBUMIN, LABURIC  Body mass index is 27.26 kg/m.  Orders:  No orders of the defined types were placed in this encounter.  Meds ordered this encounter  Medications  . oxyCODONE-acetaminophen (PERCOCET/ROXICET) 5-325 MG tablet    Sig: Take 1 tablet by mouth every 6 (six) hours as needed for moderate pain or severe pain.    Dispense:  28 tablet    Refill:  0     Procedures: Large Joint Inj: L knee on 12/12/2018 10:29 AM Indications: pain and diagnostic evaluation Details: 22 G  1.5 in needle, anterolateral approach  Arthrogram: No  Medications: 5 mL lidocaine 1 %; 40 mg methylPREDNISolone acetate 40 MG/ML Aspirate: 60 mL clear Outcome: tolerated well, no immediate complications Procedure, treatment alternatives, risks and benefits explained, specific risks discussed. Consent was given by the patient. Immediately prior to procedure a time out was called to verify the correct patient, procedure, equipment, support staff and site/side marked as required. Patient was prepped and draped in the usual sterile fashion.      Clinical Data: No additional findings.  ROS:  All other systems negative, except as noted in the HPI. Review of Systems  Objective: Vital Signs: Ht 5\' 10"  (1.778 m)   Wt 190 lb (86.2 kg)   BMI 27.26 kg/m   Specialty Comments:  No specialty comments available.  PMFS History: Patient Active Problem List   Diagnosis Date Noted  . Effusion, left knee 02/13/2018  . Varicose veins of right lower extremity with complications 04/18/2016   Past Medical History:  Diagnosis Date  . Chronic back pain   . Dyslipidemia   . GERD (gastroesophageal reflux disease)   . HIV positive (HCC)   . Kidney stone   . Varicose veins     Family History  Problem Relation Age of Onset  . Heart disease Mother   . Hypertension Mother     Past Surgical History:  Procedure  Laterality Date  . KNEE SURGERY     Social History   Occupational History  . Not on file  Tobacco Use  . Smoking status: Never Smoker  . Smokeless tobacco: Never Used  Substance and Sexual Activity  . Alcohol use: Yes    Alcohol/week: 0.0 standard drinks    Comment: 10 beers /week  . Drug use: No  . Sexual activity: Not on file

## 2018-12-30 DIAGNOSIS — Z125 Encounter for screening for malignant neoplasm of prostate: Secondary | ICD-10-CM | POA: Diagnosis not present

## 2018-12-30 DIAGNOSIS — E782 Mixed hyperlipidemia: Secondary | ICD-10-CM | POA: Diagnosis not present

## 2018-12-30 DIAGNOSIS — Z21 Asymptomatic human immunodeficiency virus [HIV] infection status: Secondary | ICD-10-CM | POA: Diagnosis not present

## 2018-12-30 DIAGNOSIS — Z0000 Encounter for general adult medical examination without abnormal findings: Secondary | ICD-10-CM | POA: Diagnosis not present

## 2018-12-30 DIAGNOSIS — N529 Male erectile dysfunction, unspecified: Secondary | ICD-10-CM | POA: Diagnosis not present

## 2018-12-31 ENCOUNTER — Telehealth (INDEPENDENT_AMBULATORY_CARE_PROVIDER_SITE_OTHER): Payer: Self-pay

## 2018-12-31 NOTE — Telephone Encounter (Signed)
Called and sw pt he answered NO to all COVID-19 questions. appt tomorrow @11am 

## 2019-01-01 ENCOUNTER — Other Ambulatory Visit: Payer: Self-pay

## 2019-01-01 ENCOUNTER — Encounter (INDEPENDENT_AMBULATORY_CARE_PROVIDER_SITE_OTHER): Payer: Self-pay | Admitting: Family

## 2019-01-01 ENCOUNTER — Ambulatory Visit (INDEPENDENT_AMBULATORY_CARE_PROVIDER_SITE_OTHER): Payer: BLUE CROSS/BLUE SHIELD | Admitting: Family

## 2019-01-01 VITALS — Ht 70.0 in | Wt 190.0 lb

## 2019-01-01 DIAGNOSIS — M25462 Effusion, left knee: Secondary | ICD-10-CM | POA: Diagnosis not present

## 2019-01-01 NOTE — Progress Notes (Signed)
   Office Visit Note   Patient: Devin Hines.           Date of Birth: 15-Feb-1966           MRN: [id number] Visit Date: 01/01/2019              Requested by: Catha Gosselin, MD [address] [place], [place] [address] PCP: Soundra Pilon, FNP  Chief Complaint  Patient presents with  . Left Knee - Follow-up    09/2019 knee scope and debridement       HPI: Patient is a 53 year old gentleman who presents in follow-up status post left knee arthroscopic debridement on 09/03/18. he has had recurrent effusions. patient states that he is now feeling much better, less edema and pain. Would like to resume jogging.  Assessment & Plan: Visit Diagnoses:  1. Effusion, left knee     Plan: only mild effusion today. May return to activities as tolerated. NSAIDs for pain on prn basis. Follow up as needed.  Follow-Up Instructions: Return if symptoms worsen or fail to improve.   Ortho Exam  Patient is alert, oriented, no adenopathy, well-dressed, normal affect, normal respiratory effort. Examination patient does have a mild effusion. there is no redness no cellulitis no pain with range of motion of the knee. Will defer aspiration today.  Imaging: No results found. No images are attached to the encounter.  Labs: No results found for: HGBA1C, ESRSEDRATE, CRP, LABURIC, REPTSTATUS, GRAMSTAIN, CULT, LABORGA   No results found for: ALBUMIN, PREALBUMIN, LABURIC  Body mass index is 27.26 kg/m.  Orders:  No orders of the defined types were placed in this encounter.  No orders of the defined types were placed in this encounter.    Procedures: No procedures performed  Clinical Data: No additional findings.  ROS:  All other systems negative, except as noted in the HPI. Review of Systems  Constitutional: Negative for chills and fever.  Musculoskeletal: Positive for arthralgias and joint swelling.    Objective: Vital Signs: Ht 5\' 10"  (1.778 m)   Wt 190 lb (86.2 kg)   BMI  27.26 kg/m   Specialty Comments:  No specialty comments available.  PMFS History: Patient Active Problem List   Diagnosis Date Noted  . Effusion, left knee 02/13/2018  . Varicose veins of right lower extremity with complications 04/18/2016   Past Medical History:  Diagnosis Date  . Chronic back pain   . Dyslipidemia   . GERD (gastroesophageal reflux disease)   . HIV positive (HCC)   . Kidney stone   . Varicose veins     Family History  Problem Relation Age of Onset  . Heart disease Mother   . Hypertension Mother     Past Surgical History:  Procedure Laterality Date  . KNEE SURGERY     Social History   Occupational History  . Not on file  Tobacco Use  . Smoking status: Never Smoker  . Smokeless tobacco: Never Used  Substance and Sexual Activity  . Alcohol use: Yes    Alcohol/week: 0.0 standard drinks    Comment: 10 beers /week  . Drug use: No  . Sexual activity: Not on file

## 2019-01-02 ENCOUNTER — Ambulatory Visit (INDEPENDENT_AMBULATORY_CARE_PROVIDER_SITE_OTHER): Payer: BLUE CROSS/BLUE SHIELD | Admitting: Orthopedic Surgery

## 2019-04-21 DIAGNOSIS — M2041 Other hammer toe(s) (acquired), right foot: Secondary | ICD-10-CM | POA: Diagnosis not present

## 2019-04-21 DIAGNOSIS — M2042 Other hammer toe(s) (acquired), left foot: Secondary | ICD-10-CM | POA: Diagnosis not present

## 2019-04-21 DIAGNOSIS — B079 Viral wart, unspecified: Secondary | ICD-10-CM | POA: Diagnosis not present

## 2019-04-21 DIAGNOSIS — B353 Tinea pedis: Secondary | ICD-10-CM | POA: Diagnosis not present

## 2019-04-21 DIAGNOSIS — M79672 Pain in left foot: Secondary | ICD-10-CM | POA: Diagnosis not present

## 2019-04-21 DIAGNOSIS — M79671 Pain in right foot: Secondary | ICD-10-CM | POA: Diagnosis not present

## 2019-05-02 DIAGNOSIS — Z1159 Encounter for screening for other viral diseases: Secondary | ICD-10-CM | POA: Diagnosis not present

## 2019-05-05 DIAGNOSIS — M79672 Pain in left foot: Secondary | ICD-10-CM | POA: Diagnosis not present

## 2019-05-05 DIAGNOSIS — B353 Tinea pedis: Secondary | ICD-10-CM | POA: Diagnosis not present

## 2019-05-05 DIAGNOSIS — M79671 Pain in right foot: Secondary | ICD-10-CM | POA: Diagnosis not present

## 2019-05-05 DIAGNOSIS — B351 Tinea unguium: Secondary | ICD-10-CM | POA: Diagnosis not present

## 2019-05-05 DIAGNOSIS — L600 Ingrowing nail: Secondary | ICD-10-CM | POA: Diagnosis not present

## 2019-05-21 IMAGING — MR MR KNEE*L* W/O CM
4 of 7 series · 22 of 40 positions shown · non-contrast
Comparison: Plain films left knee 01/04/2017.

CLINICAL DATA: Chronic left knee pain. History of left knee surgery
in 6001. No known injury.

EXAM:
MRI OF THE LEFT KNEE WITHOUT CONTRAST
TECHNIQUE: Multiplanar, multisequence MR imaging of the knee was performed. No
intravenous contrast was administered.

[Series 3: T2 fat-sat · axial · 4.0mm · 0.50mm/px · z∈[-3,+112]mm · 5 of 24 slices shown]
[im 1/24]
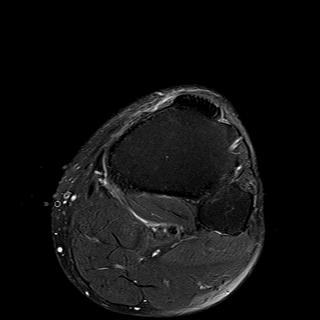
[im 6/24]
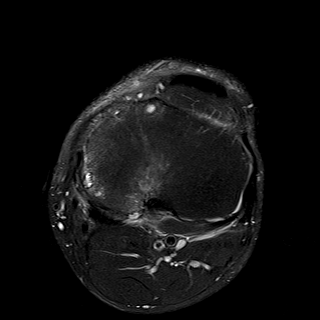
[im 12/24]
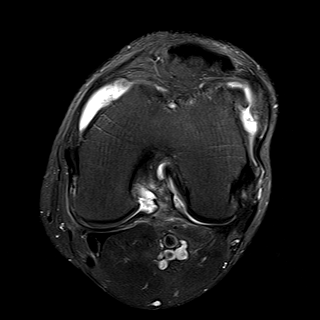
[im 18/24]
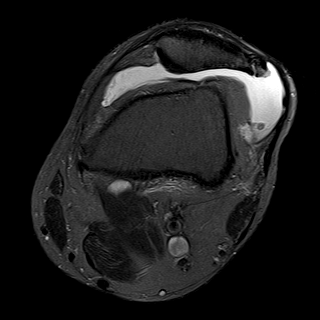
[im 24/24]
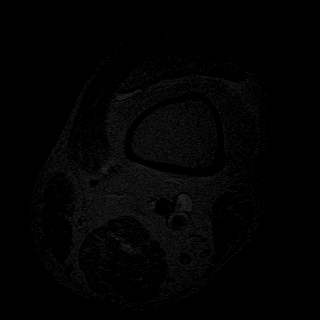

[Series 7: PD fat-sat · sagittal · 3.0mm · 0.29mm/px · 7 of 30 slices shown (1 of 3)]
[im 1/30]
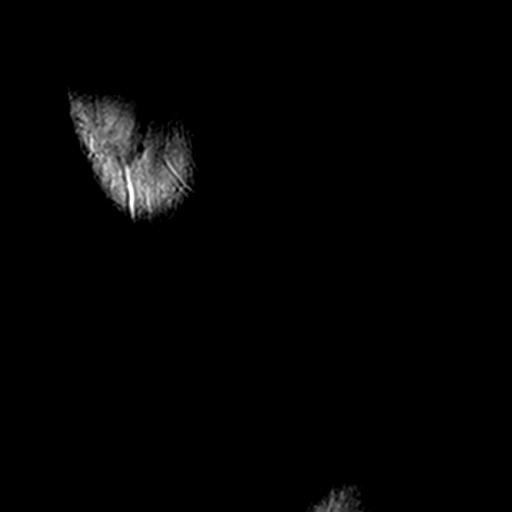
[im 5/30]
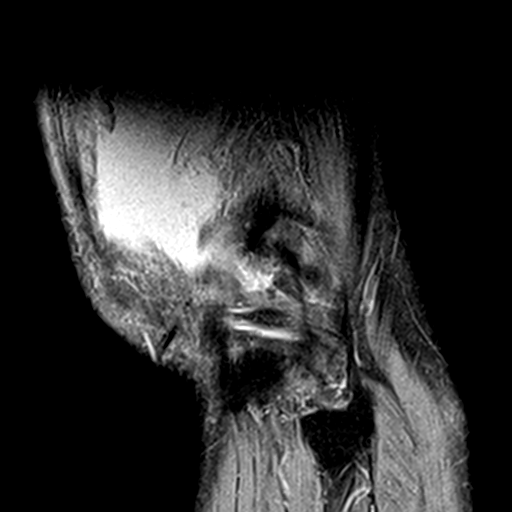
[im 10/30]
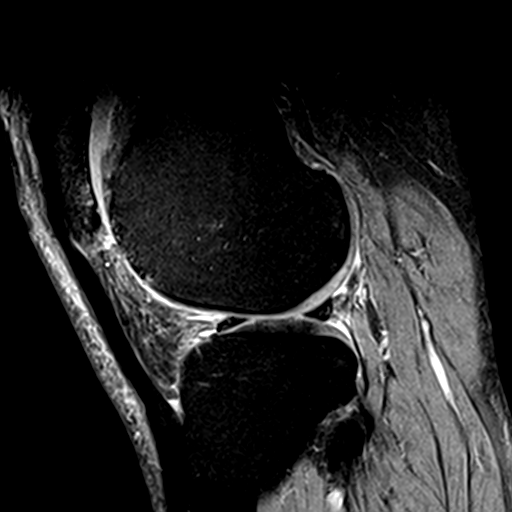
[im 15/30]
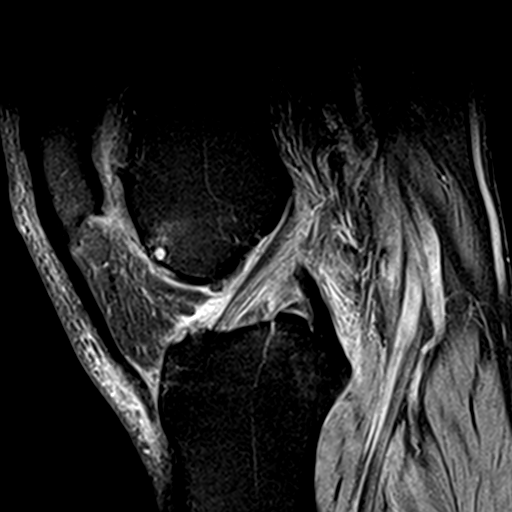
[im 20/30]
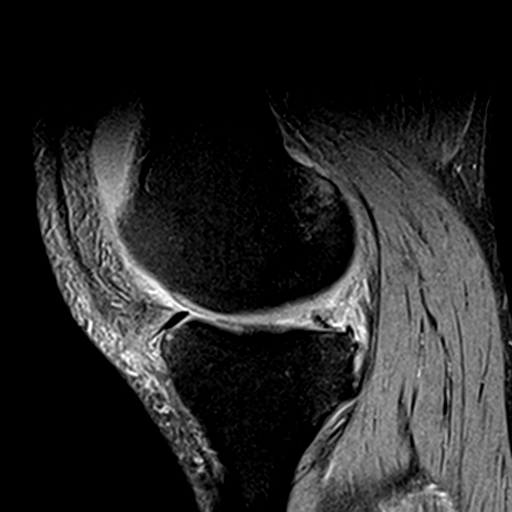
[im 25/30]
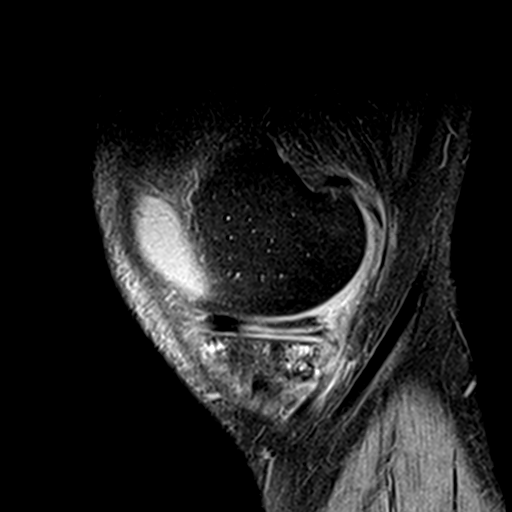
[im 30/30]
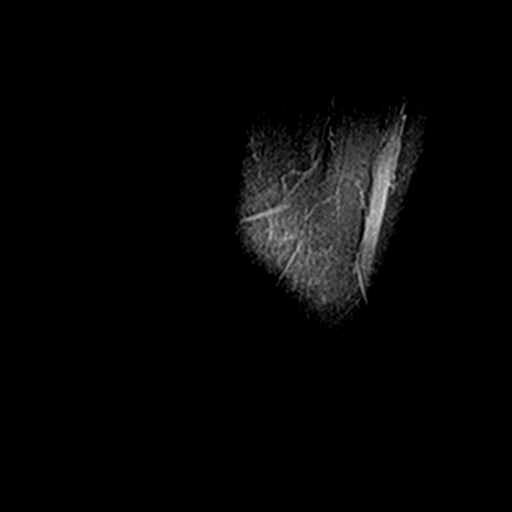

[Series 8: PD fat-sat · coronal · 3.0mm · 0.29mm/px · 7 of 28 slices shown (2 of 3)]
[im 1/28]
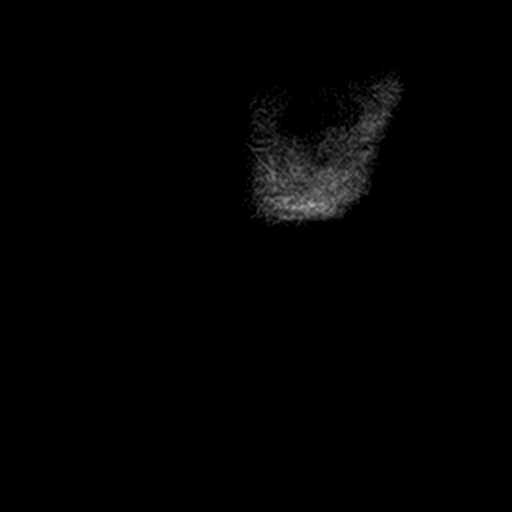
[im 5/28]
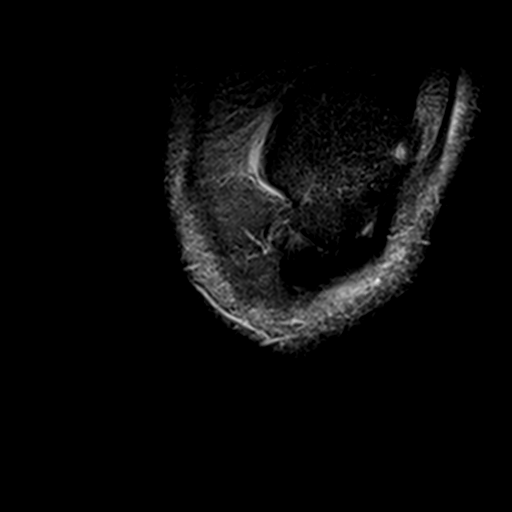
[im 10/28]
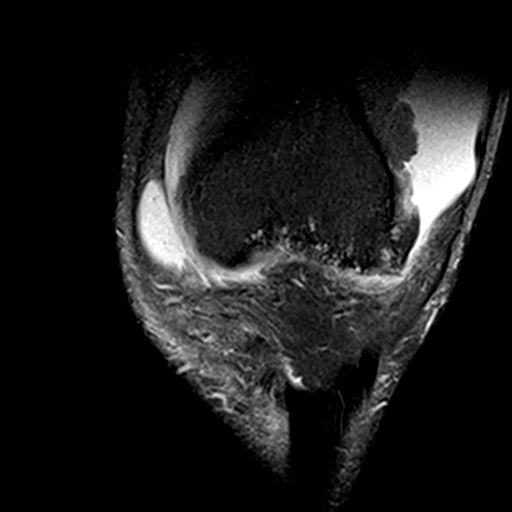
[im 14/28]
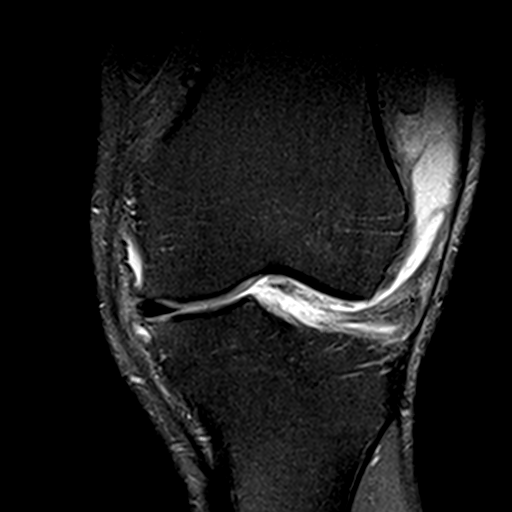
[im 19/28]
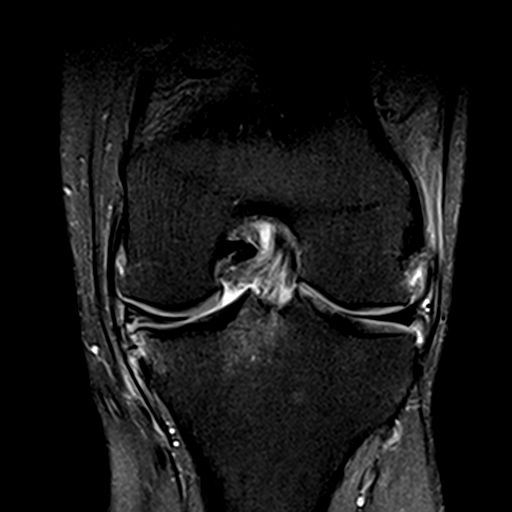
[im 23/28]
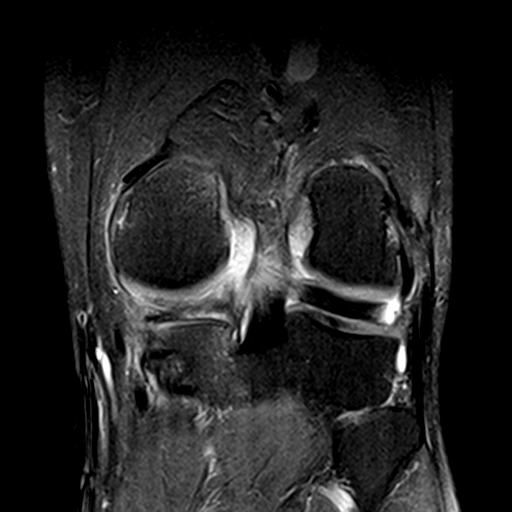
[im 28/28]
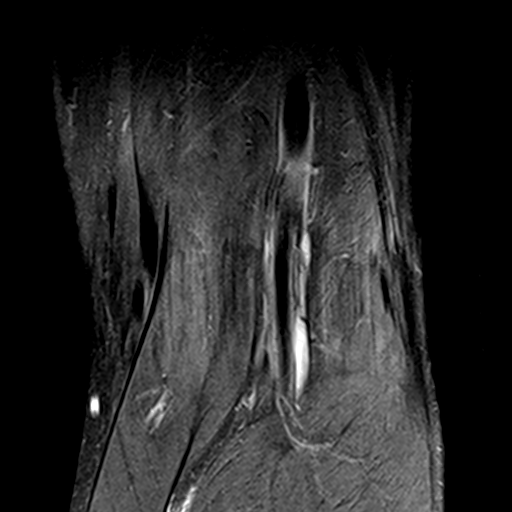

[Series 9: PD fat-sat · oblique · 2.0mm · 0.29mm/px · 3 of 11 slices shown (3 of 3)]
[im 1/11]
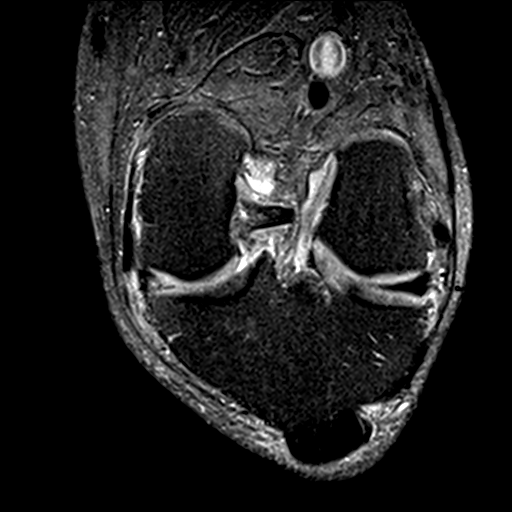
[im 6/11]
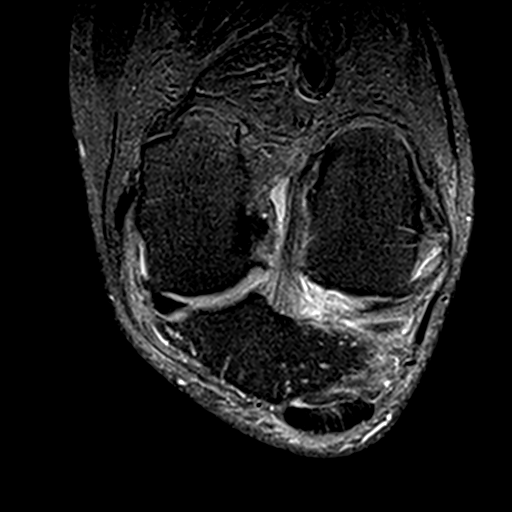
[im 11/11]
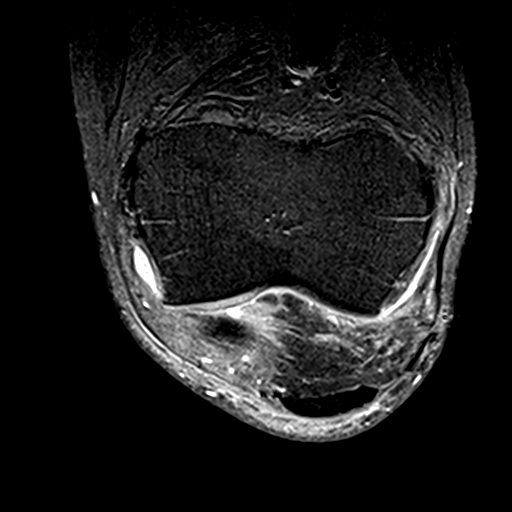

[22 of 40 positions shown; findings below may reference images not displayed]

FINDINGS: MENISCI

Medial meniscus: Tearing is seen throughout the posterior horn and
body. The tear is complex but mainly has a horizontal orientation
reaching the meniscal undersurface in the posterior horn and the
femoral articular surface in the body.

Lateral meniscus:  Partial discoid configuration without tear.

LIGAMENTS

Cruciates:  Intact.

Collaterals:  Intact.

CARTILAGE

Patellofemoral: Cartilage along the lateral patellar facet and
femoral trochlea is completely denuded.

Medial:  Moderately degenerated.

Lateral:  Mildly to moderately degenerated.

Joint:  Moderate effusion.

Popliteal Fossa:  No Baker's cyst.

Extensor Mechanism:  Intact.

Bones: No fracture or worrisome lesion. Tricompartmental osteophytes
are seen. Scattered small subchondral cysts are seen in the
patellofemoral compartment.

Other: None.
IMPRESSION: Extensive tearing posterior horn and body of the medial meniscus as
described above.

Advanced for age osteoarthritis is most severe in the patellofemoral
compartment.

Partial discoid lateral meniscus without tear.

## 2019-05-22 DIAGNOSIS — N529 Male erectile dysfunction, unspecified: Secondary | ICD-10-CM | POA: Diagnosis not present

## 2019-05-22 DIAGNOSIS — M79602 Pain in left arm: Secondary | ICD-10-CM | POA: Diagnosis not present

## 2019-05-22 DIAGNOSIS — Z21 Asymptomatic human immunodeficiency virus [HIV] infection status: Secondary | ICD-10-CM | POA: Diagnosis not present

## 2019-05-28 DIAGNOSIS — Z21 Asymptomatic human immunodeficiency virus [HIV] infection status: Secondary | ICD-10-CM | POA: Diagnosis not present

## 2019-05-28 DIAGNOSIS — K219 Gastro-esophageal reflux disease without esophagitis: Secondary | ICD-10-CM | POA: Diagnosis not present

## 2019-05-28 DIAGNOSIS — N529 Male erectile dysfunction, unspecified: Secondary | ICD-10-CM | POA: Diagnosis not present

## 2019-05-28 DIAGNOSIS — G4700 Insomnia, unspecified: Secondary | ICD-10-CM | POA: Diagnosis not present

## 2019-06-04 DIAGNOSIS — M79675 Pain in left toe(s): Secondary | ICD-10-CM | POA: Diagnosis not present

## 2019-06-04 DIAGNOSIS — B353 Tinea pedis: Secondary | ICD-10-CM | POA: Diagnosis not present

## 2019-06-04 DIAGNOSIS — B351 Tinea unguium: Secondary | ICD-10-CM | POA: Diagnosis not present

## 2019-06-04 DIAGNOSIS — M79674 Pain in right toe(s): Secondary | ICD-10-CM | POA: Diagnosis not present

## 2019-06-04 DIAGNOSIS — L600 Ingrowing nail: Secondary | ICD-10-CM | POA: Diagnosis not present

## 2019-06-16 DIAGNOSIS — Z21 Asymptomatic human immunodeficiency virus [HIV] infection status: Secondary | ICD-10-CM | POA: Diagnosis not present

## 2019-06-23 ENCOUNTER — Other Ambulatory Visit: Payer: Self-pay

## 2019-06-23 DIAGNOSIS — R6889 Other general symptoms and signs: Secondary | ICD-10-CM | POA: Diagnosis not present

## 2019-06-23 DIAGNOSIS — Z20822 Contact with and (suspected) exposure to covid-19: Secondary | ICD-10-CM

## 2019-06-25 LAB — NOVEL CORONAVIRUS, NAA: SARS-CoV-2, NAA: NOT DETECTED

## 2019-07-02 DIAGNOSIS — M79675 Pain in left toe(s): Secondary | ICD-10-CM | POA: Diagnosis not present

## 2019-07-02 DIAGNOSIS — M79674 Pain in right toe(s): Secondary | ICD-10-CM | POA: Diagnosis not present

## 2019-07-02 DIAGNOSIS — B351 Tinea unguium: Secondary | ICD-10-CM | POA: Diagnosis not present

## 2019-07-02 DIAGNOSIS — L600 Ingrowing nail: Secondary | ICD-10-CM | POA: Diagnosis not present

## 2019-08-08 DIAGNOSIS — M79675 Pain in left toe(s): Secondary | ICD-10-CM | POA: Diagnosis not present

## 2019-08-08 DIAGNOSIS — M79674 Pain in right toe(s): Secondary | ICD-10-CM | POA: Diagnosis not present

## 2019-08-08 DIAGNOSIS — B351 Tinea unguium: Secondary | ICD-10-CM | POA: Diagnosis not present

## 2019-08-08 DIAGNOSIS — L600 Ingrowing nail: Secondary | ICD-10-CM | POA: Diagnosis not present

## 2019-08-08 DIAGNOSIS — B079 Viral wart, unspecified: Secondary | ICD-10-CM | POA: Diagnosis not present

## 2019-11-07 DIAGNOSIS — B079 Viral wart, unspecified: Secondary | ICD-10-CM | POA: Diagnosis not present

## 2019-12-17 DIAGNOSIS — Z23 Encounter for immunization: Secondary | ICD-10-CM | POA: Diagnosis not present

## 2019-12-29 DIAGNOSIS — K4090 Unilateral inguinal hernia, without obstruction or gangrene, not specified as recurrent: Secondary | ICD-10-CM | POA: Diagnosis not present

## 2020-01-05 DIAGNOSIS — Z5181 Encounter for therapeutic drug level monitoring: Secondary | ICD-10-CM | POA: Diagnosis not present

## 2020-01-05 DIAGNOSIS — N529 Male erectile dysfunction, unspecified: Secondary | ICD-10-CM | POA: Diagnosis not present

## 2020-01-05 DIAGNOSIS — Z21 Asymptomatic human immunodeficiency virus [HIV] infection status: Secondary | ICD-10-CM | POA: Diagnosis not present

## 2020-01-05 DIAGNOSIS — Z125 Encounter for screening for malignant neoplasm of prostate: Secondary | ICD-10-CM | POA: Diagnosis not present

## 2020-01-05 DIAGNOSIS — E782 Mixed hyperlipidemia: Secondary | ICD-10-CM | POA: Diagnosis not present

## 2020-01-05 DIAGNOSIS — Z0000 Encounter for general adult medical examination without abnormal findings: Secondary | ICD-10-CM | POA: Diagnosis not present

## 2020-01-05 DIAGNOSIS — K4090 Unilateral inguinal hernia, without obstruction or gangrene, not specified as recurrent: Secondary | ICD-10-CM | POA: Diagnosis not present

## 2020-01-07 DIAGNOSIS — K4090 Unilateral inguinal hernia, without obstruction or gangrene, not specified as recurrent: Secondary | ICD-10-CM | POA: Diagnosis not present

## 2020-01-30 DIAGNOSIS — Z01818 Encounter for other preprocedural examination: Secondary | ICD-10-CM | POA: Diagnosis not present

## 2020-02-03 DIAGNOSIS — K4090 Unilateral inguinal hernia, without obstruction or gangrene, not specified as recurrent: Secondary | ICD-10-CM | POA: Diagnosis not present

## 2020-03-19 DIAGNOSIS — J019 Acute sinusitis, unspecified: Secondary | ICD-10-CM | POA: Diagnosis not present

## 2020-05-05 DIAGNOSIS — G4700 Insomnia, unspecified: Secondary | ICD-10-CM | POA: Diagnosis not present

## 2020-05-05 DIAGNOSIS — I10 Essential (primary) hypertension: Secondary | ICD-10-CM | POA: Diagnosis not present

## 2020-05-05 DIAGNOSIS — Z79899 Other long term (current) drug therapy: Secondary | ICD-10-CM | POA: Diagnosis not present

## 2020-05-05 DIAGNOSIS — M1712 Unilateral primary osteoarthritis, left knee: Secondary | ICD-10-CM | POA: Diagnosis not present

## 2020-05-05 DIAGNOSIS — Z21 Asymptomatic human immunodeficiency virus [HIV] infection status: Secondary | ICD-10-CM | POA: Diagnosis not present

## 2020-05-05 DIAGNOSIS — B20 Human immunodeficiency virus [HIV] disease: Secondary | ICD-10-CM | POA: Diagnosis not present

## 2020-07-01 DIAGNOSIS — J0100 Acute maxillary sinusitis, unspecified: Secondary | ICD-10-CM | POA: Diagnosis not present

## 2020-07-02 DIAGNOSIS — U071 COVID-19: Secondary | ICD-10-CM | POA: Diagnosis not present

## 2020-10-15 DIAGNOSIS — Z09 Encounter for follow-up examination after completed treatment for conditions other than malignant neoplasm: Secondary | ICD-10-CM | POA: Diagnosis not present

## 2020-10-15 DIAGNOSIS — R1032 Left lower quadrant pain: Secondary | ICD-10-CM | POA: Diagnosis not present

## 2020-11-10 DIAGNOSIS — R519 Headache, unspecified: Secondary | ICD-10-CM | POA: Diagnosis not present

## 2020-11-10 DIAGNOSIS — Z21 Asymptomatic human immunodeficiency virus [HIV] infection status: Secondary | ICD-10-CM | POA: Diagnosis not present

## 2020-11-10 DIAGNOSIS — I10 Essential (primary) hypertension: Secondary | ICD-10-CM | POA: Diagnosis not present

## 2020-11-10 DIAGNOSIS — R110 Nausea: Secondary | ICD-10-CM | POA: Diagnosis not present

## 2020-11-15 DIAGNOSIS — W19XXXA Unspecified fall, initial encounter: Secondary | ICD-10-CM | POA: Diagnosis not present

## 2020-11-15 DIAGNOSIS — R930 Abnormal findings on diagnostic imaging of skull and head, not elsewhere classified: Secondary | ICD-10-CM | POA: Diagnosis not present

## 2020-11-15 DIAGNOSIS — R55 Syncope and collapse: Secondary | ICD-10-CM | POA: Diagnosis not present

## 2020-11-16 DIAGNOSIS — R1032 Left lower quadrant pain: Secondary | ICD-10-CM | POA: Diagnosis not present

## 2020-11-25 DIAGNOSIS — I6782 Cerebral ischemia: Secondary | ICD-10-CM | POA: Diagnosis not present

## 2020-11-25 DIAGNOSIS — R9082 White matter disease, unspecified: Secondary | ICD-10-CM | POA: Diagnosis not present

## 2020-11-25 DIAGNOSIS — G9389 Other specified disorders of brain: Secondary | ICD-10-CM | POA: Diagnosis not present

## 2020-12-28 DIAGNOSIS — Z0189 Encounter for other specified special examinations: Secondary | ICD-10-CM | POA: Diagnosis not present

## 2021-01-06 DIAGNOSIS — Z23 Encounter for immunization: Secondary | ICD-10-CM | POA: Diagnosis not present

## 2021-01-06 DIAGNOSIS — Z21 Asymptomatic human immunodeficiency virus [HIV] infection status: Secondary | ICD-10-CM | POA: Diagnosis not present

## 2021-01-06 DIAGNOSIS — E782 Mixed hyperlipidemia: Secondary | ICD-10-CM | POA: Diagnosis not present

## 2021-01-06 DIAGNOSIS — I10 Essential (primary) hypertension: Secondary | ICD-10-CM | POA: Diagnosis not present

## 2021-01-06 DIAGNOSIS — Z0000 Encounter for general adult medical examination without abnormal findings: Secondary | ICD-10-CM | POA: Diagnosis not present

## 2021-01-06 DIAGNOSIS — N529 Male erectile dysfunction, unspecified: Secondary | ICD-10-CM | POA: Diagnosis not present

## 2021-01-06 DIAGNOSIS — Z125 Encounter for screening for malignant neoplasm of prostate: Secondary | ICD-10-CM | POA: Diagnosis not present

## 2021-01-26 DIAGNOSIS — I10 Essential (primary) hypertension: Secondary | ICD-10-CM | POA: Diagnosis not present

## 2021-03-09 DIAGNOSIS — I10 Essential (primary) hypertension: Secondary | ICD-10-CM | POA: Diagnosis not present

## 2021-04-12 DIAGNOSIS — L539 Erythematous condition, unspecified: Secondary | ICD-10-CM | POA: Diagnosis not present

## 2021-04-12 DIAGNOSIS — M7989 Other specified soft tissue disorders: Secondary | ICD-10-CM | POA: Diagnosis not present

## 2021-06-10 DIAGNOSIS — M722 Plantar fascial fibromatosis: Secondary | ICD-10-CM | POA: Diagnosis not present

## 2021-06-10 DIAGNOSIS — M7732 Calcaneal spur, left foot: Secondary | ICD-10-CM | POA: Diagnosis not present

## 2021-06-10 DIAGNOSIS — M65872 Other synovitis and tenosynovitis, left ankle and foot: Secondary | ICD-10-CM | POA: Diagnosis not present

## 2021-06-23 DIAGNOSIS — M722 Plantar fascial fibromatosis: Secondary | ICD-10-CM | POA: Diagnosis not present

## 2021-06-23 DIAGNOSIS — M65872 Other synovitis and tenosynovitis, left ankle and foot: Secondary | ICD-10-CM | POA: Diagnosis not present

## 2021-07-07 DIAGNOSIS — M722 Plantar fascial fibromatosis: Secondary | ICD-10-CM | POA: Diagnosis not present

## 2021-07-07 DIAGNOSIS — M25572 Pain in left ankle and joints of left foot: Secondary | ICD-10-CM | POA: Diagnosis not present

## 2021-07-22 DIAGNOSIS — J069 Acute upper respiratory infection, unspecified: Secondary | ICD-10-CM | POA: Diagnosis not present

## 2021-07-22 DIAGNOSIS — Z03818 Encounter for observation for suspected exposure to other biological agents ruled out: Secondary | ICD-10-CM | POA: Diagnosis not present

## 2021-07-22 DIAGNOSIS — R059 Cough, unspecified: Secondary | ICD-10-CM | POA: Diagnosis not present

## 2021-07-22 DIAGNOSIS — J029 Acute pharyngitis, unspecified: Secondary | ICD-10-CM | POA: Diagnosis not present

## 2021-08-31 DIAGNOSIS — Z0189 Encounter for other specified special examinations: Secondary | ICD-10-CM | POA: Diagnosis not present

## 2021-10-23 DIAGNOSIS — J0101 Acute recurrent maxillary sinusitis: Secondary | ICD-10-CM | POA: Diagnosis not present

## 2021-12-13 DIAGNOSIS — R109 Unspecified abdominal pain: Secondary | ICD-10-CM | POA: Diagnosis not present

## 2021-12-13 DIAGNOSIS — R310 Gross hematuria: Secondary | ICD-10-CM | POA: Diagnosis not present

## 2021-12-13 DIAGNOSIS — N200 Calculus of kidney: Secondary | ICD-10-CM | POA: Diagnosis not present

## 2021-12-27 DIAGNOSIS — B349 Viral infection, unspecified: Secondary | ICD-10-CM | POA: Diagnosis not present

## 2021-12-28 DIAGNOSIS — J0140 Acute pansinusitis, unspecified: Secondary | ICD-10-CM | POA: Diagnosis not present

## 2021-12-28 DIAGNOSIS — I10 Essential (primary) hypertension: Secondary | ICD-10-CM | POA: Diagnosis not present

## 2022-01-10 DIAGNOSIS — Z0189 Encounter for other specified special examinations: Secondary | ICD-10-CM | POA: Diagnosis not present

## 2022-01-10 DIAGNOSIS — B20 Human immunodeficiency virus [HIV] disease: Secondary | ICD-10-CM | POA: Diagnosis not present

## 2022-01-10 DIAGNOSIS — Z79899 Other long term (current) drug therapy: Secondary | ICD-10-CM | POA: Diagnosis not present

## 2022-01-16 DIAGNOSIS — Z23 Encounter for immunization: Secondary | ICD-10-CM | POA: Diagnosis not present

## 2022-01-16 DIAGNOSIS — M79641 Pain in right hand: Secondary | ICD-10-CM | POA: Diagnosis not present

## 2022-01-16 DIAGNOSIS — Z125 Encounter for screening for malignant neoplasm of prostate: Secondary | ICD-10-CM | POA: Diagnosis not present

## 2022-01-16 DIAGNOSIS — I10 Essential (primary) hypertension: Secondary | ICD-10-CM | POA: Diagnosis not present

## 2022-01-16 DIAGNOSIS — Z0000 Encounter for general adult medical examination without abnormal findings: Secondary | ICD-10-CM | POA: Diagnosis not present

## 2022-01-16 DIAGNOSIS — E782 Mixed hyperlipidemia: Secondary | ICD-10-CM | POA: Diagnosis not present

## 2022-01-16 DIAGNOSIS — N529 Male erectile dysfunction, unspecified: Secondary | ICD-10-CM | POA: Diagnosis not present

## 2022-05-11 DIAGNOSIS — H02825 Cysts of left lower eyelid: Secondary | ICD-10-CM | POA: Diagnosis not present

## 2022-05-11 DIAGNOSIS — H04562 Stenosis of left lacrimal punctum: Secondary | ICD-10-CM | POA: Diagnosis not present

## 2022-05-12 DIAGNOSIS — M542 Cervicalgia: Secondary | ICD-10-CM | POA: Diagnosis not present

## 2022-05-31 DIAGNOSIS — M19042 Primary osteoarthritis, left hand: Secondary | ICD-10-CM | POA: Diagnosis not present

## 2022-05-31 DIAGNOSIS — M19011 Primary osteoarthritis, right shoulder: Secondary | ICD-10-CM | POA: Diagnosis not present

## 2022-05-31 DIAGNOSIS — M1712 Unilateral primary osteoarthritis, left knee: Secondary | ICD-10-CM | POA: Diagnosis not present

## 2022-05-31 DIAGNOSIS — B20 Human immunodeficiency virus [HIV] disease: Secondary | ICD-10-CM | POA: Diagnosis not present

## 2022-05-31 DIAGNOSIS — I10 Essential (primary) hypertension: Secondary | ICD-10-CM | POA: Diagnosis not present

## 2022-05-31 DIAGNOSIS — Z79899 Other long term (current) drug therapy: Secondary | ICD-10-CM | POA: Diagnosis not present

## 2022-06-09 ENCOUNTER — Other Ambulatory Visit: Payer: Self-pay | Admitting: Family Medicine

## 2022-06-09 DIAGNOSIS — M25511 Pain in right shoulder: Secondary | ICD-10-CM

## 2022-06-14 DIAGNOSIS — M25511 Pain in right shoulder: Secondary | ICD-10-CM | POA: Diagnosis not present

## 2022-06-14 DIAGNOSIS — M542 Cervicalgia: Secondary | ICD-10-CM | POA: Diagnosis not present

## 2022-06-22 DIAGNOSIS — D485 Neoplasm of uncertain behavior of skin: Secondary | ICD-10-CM | POA: Diagnosis not present

## 2022-06-25 ENCOUNTER — Ambulatory Visit
Admission: RE | Admit: 2022-06-25 | Discharge: 2022-06-25 | Disposition: A | Discharge status: Home / Self Care | Payer: BC Managed Care – PPO | Source: Ambulatory Visit | Attending: Family Medicine | Admitting: Family Medicine

## 2022-06-25 DIAGNOSIS — M25511 Pain in right shoulder: Secondary | ICD-10-CM

## 2022-06-29 DIAGNOSIS — M25511 Pain in right shoulder: Secondary | ICD-10-CM | POA: Diagnosis not present

## 2022-07-06 DIAGNOSIS — M25511 Pain in right shoulder: Secondary | ICD-10-CM | POA: Diagnosis not present

## 2022-07-12 DIAGNOSIS — M25511 Pain in right shoulder: Secondary | ICD-10-CM | POA: Diagnosis not present

## 2022-07-19 DIAGNOSIS — R7309 Other abnormal glucose: Secondary | ICD-10-CM | POA: Diagnosis not present

## 2022-07-19 DIAGNOSIS — M25511 Pain in right shoulder: Secondary | ICD-10-CM | POA: Diagnosis not present

## 2022-07-19 DIAGNOSIS — I10 Essential (primary) hypertension: Secondary | ICD-10-CM | POA: Diagnosis not present

## 2022-07-19 DIAGNOSIS — N529 Male erectile dysfunction, unspecified: Secondary | ICD-10-CM | POA: Diagnosis not present

## 2022-07-26 DIAGNOSIS — M67813 Other specified disorders of tendon, right shoulder: Secondary | ICD-10-CM | POA: Diagnosis not present

## 2022-07-31 DIAGNOSIS — M25511 Pain in right shoulder: Secondary | ICD-10-CM | POA: Diagnosis not present

## 2022-08-08 DIAGNOSIS — J0100 Acute maxillary sinusitis, unspecified: Secondary | ICD-10-CM | POA: Diagnosis not present

## 2022-08-09 DIAGNOSIS — M25511 Pain in right shoulder: Secondary | ICD-10-CM | POA: Diagnosis not present

## 2022-08-16 DIAGNOSIS — M25511 Pain in right shoulder: Secondary | ICD-10-CM | POA: Diagnosis not present

## 2022-08-23 DIAGNOSIS — M25511 Pain in right shoulder: Secondary | ICD-10-CM | POA: Diagnosis not present

## 2022-10-07 DIAGNOSIS — Z79899 Other long term (current) drug therapy: Secondary | ICD-10-CM | POA: Diagnosis not present

## 2022-10-21 DIAGNOSIS — J0100 Acute maxillary sinusitis, unspecified: Secondary | ICD-10-CM | POA: Diagnosis not present

## 2023-01-04 ENCOUNTER — Other Ambulatory Visit (INDEPENDENT_AMBULATORY_CARE_PROVIDER_SITE_OTHER): Payer: BC Managed Care – PPO

## 2023-01-04 ENCOUNTER — Ambulatory Visit (INDEPENDENT_AMBULATORY_CARE_PROVIDER_SITE_OTHER): Payer: BC Managed Care – PPO | Admitting: Orthopedic Surgery

## 2023-01-04 ENCOUNTER — Encounter: Payer: Self-pay | Admitting: Orthopedic Surgery

## 2023-01-04 DIAGNOSIS — M25561 Pain in right knee: Secondary | ICD-10-CM

## 2023-01-04 DIAGNOSIS — G8929 Other chronic pain: Secondary | ICD-10-CM

## 2023-01-04 DIAGNOSIS — M79641 Pain in right hand: Secondary | ICD-10-CM

## 2023-01-04 MED ORDER — METHYLPREDNISOLONE ACETATE 40 MG/ML IJ SUSP
40.0000 mg | INTRAMUSCULAR | Status: AC | PRN
  Administered 2023-01-04: 40 mg via INTRA_ARTICULAR

## 2023-01-04 MED ORDER — LIDOCAINE HCL (PF) 1 % IJ SOLN
5.0000 mL | INTRAMUSCULAR | Status: AC | PRN
  Administered 2023-01-04: 5 mL

## 2023-01-04 NOTE — Progress Notes (Signed)
Office Visit Note   Patient: Devin Hines.           Date of Birth: July 23, 1966           MRN: [id number] Visit Date: 01/04/2023              Requested by: Kristen Loader, [place],  [place] [address] PCP: Kristen Loader, FNP  Chief Complaint  Patient presents with   Right Knee - Pain   Right Hand - Pain      HPI: Patient is a 57 year old gentleman who complains of pain and swelling right index finger MCP joint as well as recent swelling of the right knee.  Patient states he does have start up stiffness and decreased range of motion.  Patient states he has had uric acid levels drawn in the past that were negative for gout.  Assessment & Plan: Visit Diagnoses:  1. Pain in right hand   2. Chronic pain of right knee     Plan: Right knee was injected and a uric acid level was drawn.  Discussed that if he does have an elevated uric acid we would start gout medication.  Follow-Up Instructions: Return if symptoms worsen or fail to improve.   Ortho Exam  Patient is alert, oriented, no adenopathy, well-dressed, normal affect, normal respiratory effort. Examination patient has pain to palpation over the index MCP joint.  There is swelling.  No redness no cellulitis.  Patient has active flexion and extension.  Examination of the right knee there is a mild effusion collaterals and cruciates are stable medial lateral joint lines are nontender to palpation there is mild crepitation of the patellofemoral joint with range of motion.  Imaging: XR Knee 1-2 Views Right  Result Date: 01/04/2023 2 view radiographs of the right hand shows joint space narrowing MCP joint index finger with a small periarticular cyst.  No images are attached to the encounter.  Labs: No results found for: "HGBA1C", "ESRSEDRATE", "CRP", "LABURIC", "REPTSTATUS", "GRAMSTAIN", "CULT", "LABORGA"   No results found for: "ALBUMIN", "PREALBUMIN", "CBC"  No results found for: "MG" No results  found for: "VD25OH"  No results found for: "PREALBUMIN"    Latest Ref Rng & Units 06/29/2015    3:50 PM  CBC EXTENDED  WBC 4.0 - 10.5 K/uL 3.2   RBC 4.22 - 5.81 MIL/uL 4.76   Hemoglobin 13.0 - 17.0 g/dL 15.3   HCT 39.0 - 52.0 % 43.3   Platelets 150 - 400 K/uL 149   NEUT# 1.7 - 7.7 K/uL 2.2   Lymph# 0.7 - 4.0 K/uL 0.6      There is no height or weight on file to calculate BMI.  Orders:  Orders Placed This Encounter  Procedures   XR Hand Complete Right   XR Knee 1-2 Views Right   Uric acid   No orders of the defined types were placed in this encounter.    Procedures: Large Joint Inj: R knee on 01/04/2023 9:19 AM Indications: pain and diagnostic evaluation Details: 22 G 1.5 in needle, anteromedial approach  Arthrogram: No  Medications: 5 mL lidocaine (PF) 1 %; 40 mg methylPREDNISolone acetate 40 MG/ML Outcome: tolerated well, no immediate complications Procedure, treatment alternatives, risks and benefits explained, specific risks discussed. Consent was given by the patient. Immediately prior to procedure a time out was called to verify the correct patient, procedure, equipment, support staff and site/side marked as required. Patient was prepped and draped in the usual sterile  fashion.      Clinical Data: No additional findings.  ROS:  All other systems negative, except as noted in the HPI. Review of Systems  Objective: Vital Signs: There were no vitals taken for this visit.  Specialty Comments:  No specialty comments available.  PMFS History: Patient Active Problem List   Diagnosis Date Noted   Effusion, left knee 02/13/2018   Varicose veins of right lower extremity with complications [id number]   Past Medical History:  Diagnosis Date   Chronic back pain    Dyslipidemia    GERD (gastroesophageal reflux disease)    HIV positive    Kidney stone    Varicose veins     Family History  Problem Relation Age of Onset   Heart disease Mother    Hypertension  Mother     Past Surgical History:  Procedure Laterality Date   KNEE SURGERY     Social History   Occupational History   Not on file  Tobacco Use   Smoking status: Never   Smokeless tobacco: Never  Substance and Sexual Activity   Alcohol use: Yes    Alcohol/week: 0.0 standard drinks of alcohol    Comment: 10 beers /week   Drug use: No   Sexual activity: Not on file

## 2023-01-05 ENCOUNTER — Telehealth: Payer: Self-pay

## 2023-01-05 LAB — URIC ACID: Uric Acid, Serum: 5.9 mg/dL (ref 4.0–8.0)

## 2023-01-05 NOTE — Telephone Encounter (Signed)
I called and sw pt and advised of message below. He voiced understanding and will call with any questions.

## 2023-01-05 NOTE — Telephone Encounter (Signed)
-----   Message from Nadara Mustard, MD sent at 01/05/2023  8:26 AM EDT ----- Call patient.  Uric acid at 5.9.  No evidence of gout. ----- Message ----- From: Interface, [facility] Lab Results In Sent: 01/05/2023   2:47 AM EDT To: Nadara Mustard, MD

## 2023-01-24 DIAGNOSIS — I10 Essential (primary) hypertension: Secondary | ICD-10-CM | POA: Diagnosis not present

## 2023-01-24 DIAGNOSIS — R7303 Prediabetes: Secondary | ICD-10-CM | POA: Diagnosis not present

## 2023-01-24 DIAGNOSIS — Z125 Encounter for screening for malignant neoplasm of prostate: Secondary | ICD-10-CM | POA: Diagnosis not present

## 2023-01-24 DIAGNOSIS — N529 Male erectile dysfunction, unspecified: Secondary | ICD-10-CM | POA: Diagnosis not present

## 2023-01-24 DIAGNOSIS — E782 Mixed hyperlipidemia: Secondary | ICD-10-CM | POA: Diagnosis not present

## 2023-01-24 DIAGNOSIS — Z0000 Encounter for general adult medical examination without abnormal findings: Secondary | ICD-10-CM | POA: Diagnosis not present

## 2023-05-09 DIAGNOSIS — U071 COVID-19: Secondary | ICD-10-CM | POA: Diagnosis not present

## 2023-07-31 DIAGNOSIS — J019 Acute sinusitis, unspecified: Secondary | ICD-10-CM | POA: Diagnosis not present

## 2023-08-15 DIAGNOSIS — B20 Human immunodeficiency virus [HIV] disease: Secondary | ICD-10-CM | POA: Diagnosis not present

## 2023-08-23 ENCOUNTER — Ambulatory Visit: Payer: BC Managed Care – PPO | Admitting: Orthopedic Surgery

## 2023-08-23 ENCOUNTER — Telehealth: Payer: Self-pay | Admitting: Orthopedic Surgery

## 2023-08-23 ENCOUNTER — Encounter: Payer: Self-pay | Admitting: Orthopedic Surgery

## 2023-08-23 DIAGNOSIS — M25561 Pain in right knee: Secondary | ICD-10-CM | POA: Diagnosis not present

## 2023-08-23 DIAGNOSIS — G8929 Other chronic pain: Secondary | ICD-10-CM | POA: Diagnosis not present

## 2023-08-23 MED ORDER — METHYLPREDNISOLONE ACETATE 40 MG/ML IJ SUSP
40.0000 mg | INTRAMUSCULAR | Status: AC | PRN
  Administered 2023-08-23: 40 mg via INTRA_ARTICULAR

## 2023-08-23 MED ORDER — LIDOCAINE HCL (PF) 1 % IJ SOLN
5.0000 mL | INTRAMUSCULAR | Status: AC | PRN
  Administered 2023-08-23: 5 mL

## 2023-08-23 NOTE — Progress Notes (Signed)
Office Visit Note   Patient: Devin Hines.           Date of Birth: 12-Nov-1965           MRN: [id number] Visit Date: 08/23/2023              Requested by: Soundra Pilon, FNP [id number] [place],  [place] [address] PCP: Soundra Pilon, FNP  Chief Complaint  Patient presents with   Right Knee - Pain      HPI: Patient is a 57 year old gentleman who presents in follow-up for chronic right knee pain.  He is status post an injection in the right knee in April of this year.  Patient states he did have several weeks of pain relief after the injection.  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  Patient states he has global knee pains and swelling with ambulation.  Assessment & Plan: Visit Diagnoses:  1. Chronic pain of right knee     Plan: The right knee was injected with steroid.  Will request authorization for a hyaluronic acid injection to see if this will provide him longer term relief.  Reviewed the importance of muscle strengthening to unload stress from the knee joint.  Follow-Up Instructions: Return if symptoms worsen or fail to improve.   Ortho Exam  Patient is alert, oriented, no adenopathy, well-dressed, normal affect, normal respiratory effort. Examination patient has a effusion of the right knee collaterals and cruciates are stable he is maximally tender to palpation of the medial lateral aspect of the patellofemoral joint and is also tender to palpation of the medial lateral joint line.  With range of motion there is mild crepitation with range of motion there are no mechanical symptoms in his knee.  Patient's last uric acid was 5.9.  Imaging: No results found. No images are attached to the encounter.  Labs: Lab Results  Component Value Date   LABURIC 5.9 01/04/2023     No results found for: "ALBUMIN", "PREALBUMIN", "CBC"  No results found for: "MG" No  results found for: "VD25OH"  No results found for: "PREALBUMIN"    Latest Ref Rng & Units 06/29/2015    3:50 PM  CBC EXTENDED  WBC 4.0 - 10.5 K/uL 3.2   RBC 4.22 - 5.81 MIL/uL 4.76   Hemoglobin 13.0 - 17.0 g/dL 10.2   HCT 72.5 - 36.6 % 43.3   Platelets 150 - 400 K/uL 149   NEUT# 1.7 - 7.7 K/uL 2.2   Lymph# 0.7 - 4.0 K/uL 0.6      There is no height or weight on file to calculate BMI.  Orders:  No orders of the defined types were placed in this encounter.  No orders of the defined types were placed in this encounter.    Procedures: Large Joint Inj: R knee on 08/23/2023 12:43 PM Indications: pain and diagnostic evaluation Details: 22 G 1.5 in needle, anteromedial approach  Arthrogram: No  Medications: 5 mL lidocaine (PF) 1 %; 40 mg methylPREDNISolone acetate 40 MG/ML Outcome: tolerated well, no immediate complications Procedure, treatment alternatives, risks and benefits explained, specific risks discussed. Consent was given by the patient. Immediately prior to procedure a time out was called to verify the correct patient, procedure, equipment, support staff and site/side marked as required. Patient was prepped and draped in the usual sterile fashion.      Clinical Data:  No additional findings.  ROS:  All other systems negative, except as noted in the HPI. Review of Systems  Objective: Vital Signs: There were no vitals taken for this visit.  Specialty Comments:  No specialty comments available.  PMFS History: Patient Active Problem List   Diagnosis Date Noted   Effusion, left knee 02/13/2018   Varicose veins of right lower extremity with complications 04/18/2016   Past Medical History:  Diagnosis Date   Chronic back pain    Dyslipidemia    GERD (gastroesophageal reflux disease)    HIV positive (HCC)    Kidney stone    Varicose veins     Family History  Problem Relation Age of Onset   Heart disease Mother    Hypertension Mother     Past Surgical  History:  Procedure Laterality Date   KNEE SURGERY     Social History   Occupational History   Not on file  Tobacco Use   Smoking status: Never   Smokeless tobacco: Never  Substance and Sexual Activity   Alcohol use: Yes    Alcohol/week: 0.0 standard drinks of alcohol    Comment: 10 beers /week   Drug use: No   Sexual activity: Not on file

## 2023-08-23 NOTE — Telephone Encounter (Signed)
Gel authorization for right knee  Thank you!

## 2023-08-24 NOTE — Telephone Encounter (Signed)
VOB submitted for Orthovisc, right knee

## 2023-08-28 DIAGNOSIS — R051 Acute cough: Secondary | ICD-10-CM | POA: Diagnosis not present

## 2023-09-28 ENCOUNTER — Telehealth: Payer: Self-pay | Admitting: Orthopedic Surgery

## 2023-09-28 NOTE — Telephone Encounter (Signed)
Pt called in wondering if his gel injection was approved was submitted 11/22, pt was advised he would have to wait at least 3 months after last injection which is possibly why he has not heard anything yet but he would like a call just updating him please advise

## 2023-10-01 NOTE — Telephone Encounter (Signed)
Called and left a VM advising patient to CB with any questions concerning gel injection.

## 2023-10-02 ENCOUNTER — Other Ambulatory Visit: Payer: Self-pay

## 2023-10-02 DIAGNOSIS — G8929 Other chronic pain: Secondary | ICD-10-CM

## 2023-10-16 DIAGNOSIS — F419 Anxiety disorder, unspecified: Secondary | ICD-10-CM | POA: Diagnosis not present

## 2023-11-06 DIAGNOSIS — F4310 Post-traumatic stress disorder, unspecified: Secondary | ICD-10-CM | POA: Diagnosis not present

## 2023-11-06 DIAGNOSIS — B20 Human immunodeficiency virus [HIV] disease: Secondary | ICD-10-CM | POA: Diagnosis not present

## 2023-11-06 DIAGNOSIS — G8929 Other chronic pain: Secondary | ICD-10-CM | POA: Diagnosis not present

## 2023-11-06 DIAGNOSIS — M545 Low back pain, unspecified: Secondary | ICD-10-CM | POA: Diagnosis not present

## 2023-11-13 DIAGNOSIS — F4310 Post-traumatic stress disorder, unspecified: Secondary | ICD-10-CM | POA: Diagnosis not present

## 2023-11-13 DIAGNOSIS — B20 Human immunodeficiency virus [HIV] disease: Secondary | ICD-10-CM | POA: Diagnosis not present

## 2023-11-13 DIAGNOSIS — G8929 Other chronic pain: Secondary | ICD-10-CM | POA: Diagnosis not present

## 2023-11-13 DIAGNOSIS — M545 Low back pain, unspecified: Secondary | ICD-10-CM | POA: Diagnosis not present

## 2023-11-20 DIAGNOSIS — M545 Low back pain, unspecified: Secondary | ICD-10-CM | POA: Diagnosis not present

## 2023-11-20 DIAGNOSIS — F4310 Post-traumatic stress disorder, unspecified: Secondary | ICD-10-CM | POA: Diagnosis not present

## 2023-11-20 DIAGNOSIS — G8929 Other chronic pain: Secondary | ICD-10-CM | POA: Diagnosis not present

## 2023-11-20 DIAGNOSIS — B20 Human immunodeficiency virus [HIV] disease: Secondary | ICD-10-CM | POA: Diagnosis not present

## 2023-11-27 DIAGNOSIS — F4310 Post-traumatic stress disorder, unspecified: Secondary | ICD-10-CM | POA: Diagnosis not present

## 2023-11-27 DIAGNOSIS — M545 Low back pain, unspecified: Secondary | ICD-10-CM | POA: Diagnosis not present

## 2023-11-27 DIAGNOSIS — G8929 Other chronic pain: Secondary | ICD-10-CM | POA: Diagnosis not present

## 2023-11-27 DIAGNOSIS — B20 Human immunodeficiency virus [HIV] disease: Secondary | ICD-10-CM | POA: Diagnosis not present

## 2023-12-04 DIAGNOSIS — F4310 Post-traumatic stress disorder, unspecified: Secondary | ICD-10-CM | POA: Diagnosis not present

## 2023-12-11 DIAGNOSIS — F4310 Post-traumatic stress disorder, unspecified: Secondary | ICD-10-CM | POA: Diagnosis not present

## 2023-12-25 DIAGNOSIS — F4310 Post-traumatic stress disorder, unspecified: Secondary | ICD-10-CM | POA: Diagnosis not present

## 2023-12-25 DIAGNOSIS — F1090 Alcohol use, unspecified, uncomplicated: Secondary | ICD-10-CM | POA: Diagnosis not present

## 2024-01-01 DIAGNOSIS — F4310 Post-traumatic stress disorder, unspecified: Secondary | ICD-10-CM | POA: Diagnosis not present

## 2024-01-08 DIAGNOSIS — F1090 Alcohol use, unspecified, uncomplicated: Secondary | ICD-10-CM | POA: Diagnosis not present

## 2024-01-08 DIAGNOSIS — F4310 Post-traumatic stress disorder, unspecified: Secondary | ICD-10-CM | POA: Diagnosis not present

## 2024-01-08 DIAGNOSIS — B20 Human immunodeficiency virus [HIV] disease: Secondary | ICD-10-CM | POA: Diagnosis not present

## 2024-01-08 DIAGNOSIS — M545 Low back pain, unspecified: Secondary | ICD-10-CM | POA: Diagnosis not present

## 2024-01-22 DIAGNOSIS — F4310 Post-traumatic stress disorder, unspecified: Secondary | ICD-10-CM | POA: Diagnosis not present

## 2024-01-30 DIAGNOSIS — Z21 Asymptomatic human immunodeficiency virus [HIV] infection status: Secondary | ICD-10-CM | POA: Diagnosis not present

## 2024-01-30 DIAGNOSIS — Z0000 Encounter for general adult medical examination without abnormal findings: Secondary | ICD-10-CM | POA: Diagnosis not present

## 2024-01-30 DIAGNOSIS — E782 Mixed hyperlipidemia: Secondary | ICD-10-CM | POA: Diagnosis not present

## 2024-01-30 DIAGNOSIS — I10 Essential (primary) hypertension: Secondary | ICD-10-CM | POA: Diagnosis not present

## 2024-01-30 DIAGNOSIS — K219 Gastro-esophageal reflux disease without esophagitis: Secondary | ICD-10-CM | POA: Diagnosis not present

## 2024-01-30 DIAGNOSIS — N529 Male erectile dysfunction, unspecified: Secondary | ICD-10-CM | POA: Diagnosis not present

## 2024-01-30 DIAGNOSIS — Z6828 Body mass index (BMI) 28.0-28.9, adult: Secondary | ICD-10-CM | POA: Diagnosis not present

## 2024-01-30 DIAGNOSIS — R7303 Prediabetes: Secondary | ICD-10-CM | POA: Diagnosis not present

## 2024-02-14 ENCOUNTER — Telehealth: Payer: Self-pay

## 2024-02-14 DIAGNOSIS — G8929 Other chronic pain: Secondary | ICD-10-CM

## 2024-02-14 NOTE — Telephone Encounter (Signed)
 Previously approved for Orthovisc, right knee. Will need 3 appt.  See referrals tab

## 2024-08-04 ENCOUNTER — Encounter: Payer: Self-pay | Admitting: Radiology
